# Patient Record
Sex: Female | Born: 1995 | Hispanic: Yes | Marital: Single | Smoking: Never smoker
Health system: Southern US, Community
[De-identification: ages and names within clinical notes are randomized; demographics above are authoritative.]

## PROBLEM LIST (undated history)

## (undated) DIAGNOSIS — Z789 Other specified health status: Secondary | ICD-10-CM

## (undated) DIAGNOSIS — R011 Cardiac murmur, unspecified: Secondary | ICD-10-CM

## (undated) HISTORY — DX: Cardiac murmur, unspecified: R01.1

## (undated) HISTORY — PX: BREAST SURGERY: SHX581

## (undated) HISTORY — PX: BREAST CYST EXCISION: SHX579

---

## 2018-12-15 NOTE — L&D Delivery Note (Signed)
Delivery Note At  0745 am a viable and healthy female was delivered via  (Presentation:LOA ;  ).  APGAR: 8,9 ; weight 7#3oz  .   Placenta status:delivered intact with 3 vessel  Cord:  with the following complications: MSAF, uterine atony of lower segment with large clot expressed- resolved with rectal 810mcg cytotec and bimanual massage  Anesthesia:  none Episiotomy:  none Lacerations:  2nd right sidewall laceration Suture Repair: 3.0 vicryl rapide Est. Blood Loss (mL):  800  Mom to postpartum.  Baby to Couplet care / Skin to Skin.  Dupree Givler N Aliviyah Malanga 09/17/2019, 8:22 AM

## 2019-02-03 ENCOUNTER — Emergency Department
Admission: EM | Admit: 2019-02-03 | Discharge: 2019-02-03 | Disposition: A | Discharge status: Home / Self Care | Payer: Medicaid Other | Attending: Emergency Medicine | Admitting: Emergency Medicine

## 2019-02-03 ENCOUNTER — Other Ambulatory Visit: Payer: Self-pay

## 2019-02-03 ENCOUNTER — Emergency Department: Payer: Medicaid Other

## 2019-02-03 ENCOUNTER — Encounter: Payer: Self-pay | Admitting: Emergency Medicine

## 2019-02-03 DIAGNOSIS — R102 Pelvic and perineal pain: Secondary | ICD-10-CM

## 2019-02-03 DIAGNOSIS — O209 Hemorrhage in early pregnancy, unspecified: Secondary | ICD-10-CM | POA: Diagnosis present

## 2019-02-03 DIAGNOSIS — Z3A10 10 weeks gestation of pregnancy: Secondary | ICD-10-CM | POA: Diagnosis not present

## 2019-02-03 LAB — CBC
HCT: 36.5 % (ref 36.0–46.0)
HEMOGLOBIN: 12.4 g/dL (ref 12.0–15.0)
MCH: 30.5 pg (ref 26.0–34.0)
MCHC: 34 g/dL (ref 30.0–36.0)
MCV: 89.7 fL (ref 80.0–100.0)
Platelets: 311 10*3/uL (ref 150–400)
RBC: 4.07 MIL/uL (ref 3.87–5.11)
RDW: 12.3 % (ref 11.5–15.5)
WBC: 10.8 10*3/uL — ABNORMAL HIGH (ref 4.0–10.5)
nRBC: 0 % (ref 0.0–0.2)

## 2019-02-03 LAB — BASIC METABOLIC PANEL
Anion gap: 8 (ref 5–15)
BUN: 8 mg/dL (ref 6–20)
CO2: 23 mmol/L (ref 22–32)
Calcium: 9 mg/dL (ref 8.9–10.3)
Chloride: 104 mmol/L (ref 98–111)
Creatinine, Ser: 0.49 mg/dL (ref 0.44–1.00)
GFR calc Af Amer: >60 mL/min (ref >60)
GFR calc non Af Amer: >60 mL/min (ref >60)
Glucose, Bld: 79 mg/dL (ref 70–99)
POTASSIUM: 3.7 mmol/L (ref 3.5–5.1)
SODIUM: 135 mmol/L (ref 135–145)

## 2019-02-03 LAB — WET PREP, GENITAL
Clue Cells Wet Prep HPF POC: NONE SEEN
Sperm: NONE SEEN
TRICH WET PREP: NONE SEEN
YEAST WET PREP: NONE SEEN

## 2019-02-03 LAB — HCG, QUANTITATIVE, PREGNANCY: hCG, Beta Chain, Quant, S: 180172 m[IU]/mL — ABNORMAL HIGH (ref <5)

## 2019-02-03 LAB — CHLAMYDIA/NGC RT PCR (ARMC ONLY)
Chlamydia Tr: NOT DETECTED
N gonorrhoeae: NOT DETECTED

## 2019-02-03 LAB — ABO/RH: ABO/RH(D): O POS

## 2019-02-03 NOTE — ED Notes (Signed)
Pink top for ABO/Rh sent to lab at this time.

## 2019-02-03 NOTE — ED Notes (Signed)
Pelvic cart set up at bedside  

## 2019-02-03 NOTE — ED Triage Notes (Signed)
Pt in via POV, reports abdominal pain and vaginal bleeding since 1230.  Pt is approximately [redacted] weeks pregnant, states initial OB appointment is not until March.  Vitals WDL, NAD noted at this time.

## 2019-02-03 NOTE — ED Provider Notes (Signed)
[facility] Emergency Department Provider Note  ____________________________________________  Time seen: Approximately 9:39 PM  I have reviewed the triage vital signs and the nursing notes.   HISTORY  Chief Complaint Threatened Miscarriage    HPI Krista Garcia is a 23 y.o. female 1 P0 approximately [redacted] weeks pregnant presenting for vaginal bleeding and pelvic cramping.  Patient reports that she has been seen 1 time for prenatal care, but does not remember the physician or which clinic she went to.  She reports that today just afternoon, she developed vaginal bleeding which was initially very light, but has become more heavy like a period.  She has not had any blood clots.  She has not had any lightheadedness, shortness of breath or syncope.  No change in vaginal discharge.  No fevers or chills.  History reviewed. No pertinent past medical history.  There are no active problems to display for this patient.   Past Surgical History:  Procedure Laterality Date  . BREAST SURGERY        Allergies Patient has no known allergies.  No family history on file.  Social History Social History   Tobacco Use  . Smoking status: Never Smoker  . Smokeless tobacco: Never Used  Substance Use Topics  . Alcohol use: Not Currently  . Drug use: Never    Review of Systems Constitutional: No fever/chills. Eyes: No visual changes. ENT: No sore throat. No congestion or rhinorrhea. Cardiovascular: Denies chest pain. Denies palpitations. Respiratory: Denies shortness of breath.  No cough. Gastrointestinal: As of pelvic cramping.  No nausea, no vomiting.  No diarrhea.  No constipation. Genitourinary: Negative for dysuria.  Positive vaginal bleeding.  No change in vaginal discharge. Musculoskeletal: Negative for back pain. Skin: Negative for rash. Neurological: Negative for headaches. No focal numbness, tingling or weakness.      ____________________________________________   PHYSICAL EXAM:  VITAL SIGNS: ED Triage Vitals  Enc Vitals Group     BP --      Pulse --      Resp --      Temp --      Temp src --      SpO2 --      Weight 02/03/19 1537 120 lb (54.4 kg)     Height 02/03/19 1537 5\' 2"  (1.575 m)     Head Circumference --      Peak Flow --      Pain Score 02/03/19 1536 3     Pain Loc --      Pain Edu? --      Excl. in GC? --     Constitutional: Alert and oriented.  Answers questions appropriately. Eyes: Conjunctivae are normal without pallor.  EOMI. No scleral icterus. Head: Atraumatic. Nose: No congestion/rhinnorhea. Mouth/Throat: Mucous membranes are moist.  Neck: No stridor.  Supple.   Cardiovascular: Normal rate, regular rhythm. No murmurs, rubs or gallops.  Respiratory: Normal respiratory effort.  No accessory muscle use or retractions. Lungs CTAB.  No wheezes, rales or ronchi. Gastrointestinal: Soft, nontender and nondistended.  No guarding or rebound.  No peritoneal signs. Genitourinary: Normal-appearing external genitalia without lesions. Normal vaginal exam with physiologic discharge, normal-appearing cervix, normal vaginal wall tissue. Bimanual exam is negative for CMT, adnexal tenderness to palpation, no palpable masses. Musculoskeletal: No LE edema.  Neurologic:  A&Ox3.  Speech is clear.  Face and smile are symmetric.  EOMI.  Moves all extremities well. Skin:  Skin is warm, dry and intact. No rash noted. Psychiatric:  Mood and affect are normal. Speech and behavior are normal.  Normal judgement  ____________________________________________   LABS (all labs ordered are listed, but only abnormal results are displayed)  Labs Reviewed  WET PREP, GENITAL - Abnormal; Notable for the following components:      Result Value   WBC, Wet Prep HPF POC MANY (*)    All other components within normal limits  HCG, QUANTITATIVE, PREGNANCY - Abnormal; Notable for the following components:    hCG, Beta Chain, Quant, S 180,172 (*)    All other components within normal limits  CBC - Abnormal; Notable for the following components:   WBC 10.8 (*)    All other components within normal limits  CHLAMYDIA/NGC RT PCR ([facility] ONLY)  BASIC METABOLIC PANEL  ABO/RH   ____________________________________________  EKG  None indicated ____________________________________________  RADIOLOGY  [place] Ob Transvaginal  Result Date: 02/03/2019 CLINICAL DATA:  Vaginal bleeding and pelvic pain in early pregnancy EXAM: OBSTETRIC <14 WK US AND TRANSVAGINAL OB US TECHNIQUE: Both transabdominal and transvaginal ultrasound examinations were performed for complete evaluation of the gestation as well as the maternal uterus, adnexal regions, and pelvic cul-de-sac. Transvaginal technique was performed to assess early pregnancy. COMPARISON:  None FINDINGS: Intrauterine gestational sac: Present, single Yolk sac:  Present Embryo:  Present Cardiac Activity: Present Heart Rate: 155 bpm CRL:  15.8 mm   8 w   0 d                  US EDC: 09/15/2019 Subchorionic hemorrhage:  None identified Maternal uterus/adnexae: RIGHT ovary measures 2.6 x 2.8 x 3.1 cm and contains a corpus luteum 1.9 x 1.7 x 1.4 cm. LEFT ovary not visualized likely obscured by bowel. No free pelvic fluid or adnexal masses. IMPRESSION: Single live intrauterine gestation at 8 weeks 0 days EGA by crown-rump length. No acute abnormalities. Electronically Signed   By: Ulyses SouthwardMark  Boles M.D.   On: 02/03/2019 17:42    ____________________________________________   PROCEDURES  Procedure(s) performed: None  Procedures  Critical Care performed: No ____________________________________________   INITIAL IMPRESSION / ASSESSMENT AND PLAN / ED COURSE  Pertinent labs & imaging results that were available during my care of the patient were reviewed by me and considered in my medical decision making (see chart for details).  23 y.o. female G1, P0  approximate [redacted] weeks pregnant presenting with vaginal bleeding and pelvic cramping that started today.  Overall, the patient is hemodynamically stable and has a reassuring hemoglobin and hematocrit at 12.4 and 36.5.  Her electrolytes are also within normal limits.  Her hCG today is 180,172.  She has a transvaginal ultrasound which does show a single live IUP with a fetal heart rate of 155; there is no evidence of subchorionic hemorrhage or other acute abnormalities.  I will plan to do a pelvic examination, and anticipate discharge with close obstetrics follow-up.  ----------------------------------------- 10:33 PM on 02/03/2019 -----------------------------------------  Have spoken with Dr. Ranae Plumberhelsea Ward, who will see the patient on Monday.  The patient will be instructed to call the clinic tomorrow to make an appointment for Monday.  At this time, the patient has vaginal bleeding of first trimester with an unclear etiology.  I have spoken to her about the various possibilities, and given her her follow-up instructions as well as return precautions.  At this time, I am just waiting for her Rh type for final discharge.  ____________________________________________  FINAL CLINICAL IMPRESSION(S) / ED DIAGNOSES  Final diagnoses:  Vaginal bleeding in  pregnancy, first trimester  Pelvic cramping         NEW MEDICATIONS STARTED DURING THIS VISIT:  New Prescriptions   No medications on file      Rockne Menghini, MD 02/03/19 2235

## 2019-02-03 NOTE — Discharge Instructions (Signed)
You may take Tylenol in pregnancy, but do not take any NSAID medications including Motrin, ibuprofen, Aleve or Advil.  Please follow the instructions for pelvic rest until you are cleared by your OB/GYN.  Return to the emergency department if you develop increased bleeding, soaking through more than 1 pad every 2 hours, lightheadedness, fainting, shortness of breath, fever, or any other symptoms concerning to you.

## 2019-04-01 ENCOUNTER — Other Ambulatory Visit: Payer: Self-pay | Admitting: Family Medicine

## 2019-04-01 DIAGNOSIS — Z3402 Encounter for supervision of normal first pregnancy, second trimester: Secondary | ICD-10-CM

## 2019-04-21 ENCOUNTER — Ambulatory Visit: Payer: Self-pay

## 2019-05-06 ENCOUNTER — Other Ambulatory Visit: Payer: Self-pay

## 2019-05-06 ENCOUNTER — Ambulatory Visit: Payer: Self-pay

## 2019-05-06 ENCOUNTER — Ambulatory Visit
Admission: RE | Admit: 2019-05-06 | Discharge: 2019-05-06 | Disposition: A | Discharge status: Home / Self Care | Payer: Medicaid Other | Source: Ambulatory Visit | Attending: Family Medicine | Admitting: Family Medicine

## 2019-05-06 DIAGNOSIS — Z3402 Encounter for supervision of normal first pregnancy, second trimester: Secondary | ICD-10-CM | POA: Diagnosis present

## 2019-06-27 ENCOUNTER — Other Ambulatory Visit: Payer: Self-pay

## 2019-06-27 ENCOUNTER — Observation Stay
Admission: EM | Admit: 2019-06-27 | Discharge: 2019-06-28 | Disposition: A | Discharge status: Home / Self Care | Payer: Medicaid Other | Attending: Obstetrics & Gynecology | Admitting: Obstetrics & Gynecology

## 2019-06-27 DIAGNOSIS — O26893 Other specified pregnancy related conditions, third trimester: Principal | ICD-10-CM | POA: Insufficient documentation

## 2019-06-27 DIAGNOSIS — R102 Pelvic and perineal pain: Secondary | ICD-10-CM | POA: Insufficient documentation

## 2019-06-27 DIAGNOSIS — Z3A28 28 weeks gestation of pregnancy: Secondary | ICD-10-CM | POA: Insufficient documentation

## 2019-06-27 DIAGNOSIS — Z3490 Encounter for supervision of normal pregnancy, unspecified, unspecified trimester: Secondary | ICD-10-CM

## 2019-06-27 HISTORY — DX: Other specified health status: Z78.9

## 2019-06-27 NOTE — Progress Notes (Signed)
Electronic Interpreter used for triage, [phone] [employer]

## 2019-06-27 NOTE — OB Triage Note (Signed)
Pt arrival to triage with c/o abdominal pain and lower left back pain starting yesterday afternoon.  States pain 8/10.  Pt denies vaginal bleeding and LOF. States positive fetal movement.  EFM and toco applied and assessing.

## 2019-06-28 DIAGNOSIS — O26893 Other specified pregnancy related conditions, third trimester: Secondary | ICD-10-CM | POA: Diagnosis not present

## 2019-06-28 DIAGNOSIS — Z3490 Encounter for supervision of normal pregnancy, unspecified, unspecified trimester: Secondary | ICD-10-CM

## 2019-06-28 DIAGNOSIS — R102 Pelvic and perineal pain: Secondary | ICD-10-CM | POA: Diagnosis present

## 2019-06-28 DIAGNOSIS — Z3A28 28 weeks gestation of pregnancy: Secondary | ICD-10-CM | POA: Diagnosis not present

## 2019-06-28 NOTE — OB Triage Note (Signed)
Discharge instructions provided and reviewed.  Follow up care discussed.  Pain management reviewed.  Pt verbalizes understanding.

## 2019-06-28 NOTE — Discharge Summary (Signed)
No LMP recorded. Patient is pregnant. EDC: Estimated Date of Delivery: 09/17/19 EGA: [redacted]w[redacted]d  Patient presented for evaluation of pelvic discomfort/pain.  Patient had reactive NST and her TOCO was absent.. I reviewed her vital signs and fetal tracing, both of which were reassuring.  Patient was discharged as she was not laboring, and pain was non-specific.Marland Kitchen  NST interpretation: Reactive.  Larey Days, MD Attending Obstetrician and Gynecologist [facility]

## 2019-07-05 ENCOUNTER — Encounter: Payer: Self-pay | Admitting: Certified Nurse Midwife

## 2019-07-05 ENCOUNTER — Other Ambulatory Visit: Payer: Self-pay

## 2019-07-05 ENCOUNTER — Ambulatory Visit (INDEPENDENT_AMBULATORY_CARE_PROVIDER_SITE_OTHER): Payer: Medicaid Other | Admitting: Certified Nurse Midwife

## 2019-07-05 VITALS — BP 111/66 | HR 103 | Wt 88.2 lb

## 2019-07-05 DIAGNOSIS — Z3A29 29 weeks gestation of pregnancy: Secondary | ICD-10-CM

## 2019-07-05 DIAGNOSIS — Z3403 Encounter for supervision of normal first pregnancy, third trimester: Secondary | ICD-10-CM

## 2019-07-05 LAB — POCT URINALYSIS DIPSTICK OB
Bilirubin, UA: NEGATIVE
Glucose, UA: NEGATIVE
Ketones, UA: NEGATIVE
Leukocytes, UA: NEGATIVE
Nitrite, UA: NEGATIVE
POC,PROTEIN,UA: NEGATIVE
Spec Grav, UA: 1.025 (ref 1.010–1.025)
Urobilinogen, UA: 0.2 E.U./dL
pH, UA: 5 (ref 5.0–8.0)

## 2019-07-05 NOTE — Patient Instructions (Signed)

## 2019-07-05 NOTE — Progress Notes (Signed)
TRANSFER IN OB HISTORY AND PHYSICAL  SUBJECTIVE:       Krista Garcia is a 23 y.o. G1P0 female, No LMP recorded. Patient is pregnant., Estimated Date of Delivery: 09/18/19, 9631w2d, presents today for Transition of Prenatal Care. EPIC data migration from outside records is not accomplished today. She has signed a release of medical records for [facility]. Complaints today include: breast changes      Gynecologic History No LMP recorded. Patient is pregnant. Normal Contraception: none Last Pap: waiting for records   Obstetric History OB History  Gravida Para Term Preterm AB Living  1            SAB TAB Ectopic Multiple Live Births               # Outcome Date GA Lbr Len/2nd Weight Sex Delivery Anes PTL Lv  1 Current             Past Medical History:  Diagnosis Date  . Medical history non-contributory     Past Surgical History:  Procedure Laterality Date  . BREAST SURGERY      Current Outpatient Medications on File Prior to Visit  Medication Sig Dispense Refill  . Prenatal Vit-Fe Fumarate-FA (PRENATAL MULTIVITAMIN) TABS tablet Take 1 tablet by mouth daily at 12 noon.     No current facility-administered medications on file prior to visit.     No Known Allergies  Social History   Socioeconomic History  . Marital status: Single    Spouse name: Not on file  . Number of children: Not on file  . Years of education: Not on file  . Highest education level: Not on file  Occupational History  . Not on file  Social Needs  . Financial resource strain: Not on file  . Food insecurity    Worry: Not on file    Inability: Not on file  . Transportation needs    Medical: Not on file    Non-medical: Not on file  Tobacco Use  . Smoking status: Never Smoker  . Smokeless tobacco: Never Used  Substance and Sexual Activity  . Alcohol use: Not Currently  . Drug use: Never  . Sexual activity: Yes  Lifestyle  . Physical activity    Days per week: Not on file     Minutes per session: Not on file  . Stress: Not on file  Relationships  . Social Musicianconnections    Talks on phone: Not on file    Gets together: Not on file    Attends religious service: Not on file    Active member of club or organization: Not on file    Attends meetings of clubs or organizations: Not on file    Relationship status: Not on file  . Intimate partner violence    Fear of current or ex partner: Not on file    Emotionally abused: Not on file    Physically abused: Not on file    Forced sexual activity: Not on file  Other Topics Concern  . Not on file  Social History Narrative  . Not on file    No family history on file.  The following portions of the patient's history were reviewed and updated as appropriate: allergies, current medications, past OB history, past medical history, past surgical history, past family history, past social history, and problem list.    OBJECTIVE: Initial Physical Exam (New OB)  GENERAL APPEARANCE: alert, well appearing, in no apparent distress, oriented to person,  place and time HEAD: normocephalic, atraumatic MOUTH: mucous membranes moist, pharynx normal without lesions THYROID: no thyromegaly or masses present BREASTS: exam deferred  LUNGS: clear to auscultation, no wheezes, rales or rhonchi, symmetric air entry HEART: regular rate and rhythm, no murmurs ABDOMEN: soft, nontender, nondistended, no abnormal masses, no epigastric pain, fundus soft, nontender 30 weeks size and FHT present EXTREMITIES: no redness or tenderness in the calves or thighs, no edema, no limitation in range of motion, intact peripheral pulses SKIN: normal coloration and turgor, no rashes LYMPH NODES: no adenopathy palpable NEUROLOGIC: alert, oriented, normal speech, no focal findings or movement disorder noted  PELVIC EXAM deferred   ASSESSMENT: Normal pregnancy  PLAN: New OB counseling: The patient has been given an overview regarding routine prenatal  care. Recommendations regarding diet, weight gain, and exercise in pregnancy were given. Overview of PNC discussed. Awaiting records from previous provider. Pt states she completed 3 hr glucose test does not have result. Follow up 2 wks for ROB.   Interpreter used for this visit.   Philip Aspen, CNM

## 2019-07-05 NOTE — Addendum Note (Signed)
Addended by: Raliegh Ip on: 07/05/2019 02:10 PM   Modules accepted: Orders

## 2019-07-19 ENCOUNTER — Encounter: Payer: Self-pay | Admitting: Certified Nurse Midwife

## 2019-07-19 ENCOUNTER — Other Ambulatory Visit: Payer: Self-pay

## 2019-07-19 ENCOUNTER — Ambulatory Visit (INDEPENDENT_AMBULATORY_CARE_PROVIDER_SITE_OTHER): Payer: Medicaid Other | Admitting: Certified Nurse Midwife

## 2019-07-19 VITALS — BP 109/44 | HR 84 | Wt 151.4 lb

## 2019-07-19 DIAGNOSIS — Z3403 Encounter for supervision of normal first pregnancy, third trimester: Secondary | ICD-10-CM

## 2019-07-19 LAB — POCT URINALYSIS DIPSTICK OB
Bilirubin, UA: NEGATIVE
Blood, UA: NEGATIVE
Glucose, UA: NEGATIVE
Ketones, UA: NEGATIVE
Leukocytes, UA: NEGATIVE
Nitrite, UA: NEGATIVE
POC,PROTEIN,UA: NEGATIVE
Spec Grav, UA: 1.015 (ref 1.010–1.025)
Urobilinogen, UA: 0.2 E.U./dL
pH, UA: 6.5 (ref 5.0–8.0)

## 2019-07-19 NOTE — Progress Notes (Signed)
ROB doing well. Feel good movement. Have not received records as of yet. Will send another release of medical records to [facility]. Follow up 2 wks with [facility].   Philip Aspen, CNM

## 2019-07-19 NOTE — Addendum Note (Signed)
Addended by: Raliegh Ip on: 07/19/2019 11:13 AM   Modules accepted: Orders

## 2019-07-19 NOTE — Patient Instructions (Signed)
How a Baby Grows During Pregnancy  Pregnancy begins when a female's sperm enters a female's egg (fertilization). Fertilization usually happens in one of the tubes (fallopian tubes) that connect the ovaries to the womb (uterus). The fertilized egg moves down the fallopian tube to the uterus. Once it reaches the uterus, it implants into the lining of the uterus and begins to grow. For the first 10 weeks, the fertilized egg is called an embryo. After 10 weeks, it is called a fetus. As the fetus continues to grow, it receives oxygen and nutrients through tissue (placenta) that grows to support the developing baby. The placenta is the life support system for the baby. It provides oxygen and nutrition and removes waste. Learning as much as you can about your pregnancy and how your baby is developing can help you enjoy the experience. It can also make you aware of when there might be a problem and when to ask questions. How long does a typical pregnancy last? A pregnancy usually lasts 280 days, or about 40 weeks. Pregnancy is divided into three periods of growth, also called trimesters:  First trimester: 0-12 weeks.  Second trimester: 13-27 weeks.  Third trimester: 28-40 weeks. The day when your baby is ready to be born (full term) is your estimated date of delivery. How does my baby develop month by month? First month  The fertilized egg attaches to the inside of the uterus.  Some cells will form the placenta. Others will form the fetus.  The arms, legs, brain, spinal cord, lungs, and heart begin to develop.  At the end of the first month, the heart begins to beat. Second month  The bones, inner ear, eyelids, hands, and feet form.  The genitals develop.  By the end of 8 weeks, all major organs are developing. Third month  All of the internal organs are forming.  Teeth develop below the gums.  Bones and muscles begin to grow. The spine can flex.  The skin is transparent.  Fingernails  and toenails begin to form.  Arms and legs continue to grow longer, and hands and feet develop.  The fetus is about 3 inches (7.6 cm) long. Fourth month  The placenta is completely formed.  The external sex organs, neck, outer ear, eyebrows, eyelids, and fingernails are formed.  The fetus can hear, swallow, and move its arms and legs.  The kidneys begin to produce urine.  The skin is covered with a white, waxy coating (vernix) and very fine hair (lanugo). Fifth month  The fetus moves around more and can be felt for the first time (quickening).  The fetus starts to sleep and wake up and may begin to suck its finger.  The nails grow to the end of the fingers.  The organ in the digestive system that makes bile (gallbladder) functions and helps to digest nutrients.  If your baby is a girl, eggs are present in her ovaries. If your baby is a boy, testicles start to move down into his scrotum. Sixth month  The lungs are formed.  The eyes open. The brain continues to develop.  Your baby has fingerprints and toe prints. Your baby's hair grows thicker.  At the end of the second trimester, the fetus is about 9 inches (22.9 cm) long. Seventh month  The fetus kicks and stretches.  The eyes are developed enough to sense changes in light.  The hands can make a grasping motion.  The fetus responds to sound. Eighth month  All   organs and body systems are fully developed and functioning.  Bones harden, and taste buds develop. The fetus may hiccup.  Certain areas of the brain are still developing. The skull remains soft. Ninth month  The fetus gains about  lb (0.23 kg) each week.  The lungs are fully developed.  Patterns of sleep develop.  The fetus's head typically moves into a head-down position (vertex) in the uterus to prepare for birth.  The fetus weighs 6-9 lb (2.72-4.08 kg) and is 19-20 inches (48.26-50.8 cm) long. What can I do to have a healthy pregnancy and help  my baby develop? General instructions  Take prenatal vitamins as directed by your health care provider. These include vitamins such as folic acid, iron, calcium, and vitamin D. They are important for healthy development.  Take medicines only as directed by your health care provider. Read labels and ask a pharmacist or your health care provider whether over-the-counter medicines, supplements, and prescription drugs are safe to take during pregnancy.  Keep all follow-up visits as directed by your health care provider. This is important. Follow-up visits include prenatal care and screening tests. How do I know if my baby is developing well? At each prenatal visit, your health care provider will do several different tests to check on your health and keep track of your baby's development. These include:  Fundal height and position. ? Your health care provider will measure your growing belly from your pubic bone to the top of the uterus using a tape measure. ? Your health care provider will also feel your belly to determine your baby's position.  Heartbeat. ? An ultrasound in the first trimester can confirm pregnancy and show a heartbeat, depending on how far along you are. ? Your health care provider will check your baby's heart rate at every prenatal visit.  Second trimester ultrasound. ? This ultrasound checks your baby's development. It also may show your baby's gender. What should I do if I have concerns about my baby's development? Always talk with your health care provider about any concerns that you may have about your pregnancy and your baby. Summary  A pregnancy usually lasts 280 days, or about 40 weeks. Pregnancy is divided into three periods of growth, also called trimesters.  Your health care provider will monitor your baby's growth and development throughout your pregnancy.  Follow your health care provider's recommendations about taking prenatal vitamins and medicines during  your pregnancy.  Talk with your health care provider if you have any concerns about your pregnancy or your developing baby. This information is not intended to replace advice given to you by your health care provider. Make sure you discuss any questions you have with your health care provider. Document Released: 05/19/2008 Document Revised: 03/24/2019 Document Reviewed: 10/14/2017 Elsevier Patient Education  2020 [employer]  

## 2019-08-05 ENCOUNTER — Encounter: Payer: Medicaid Other | Admitting: Obstetrics and Gynecology

## 2019-08-08 ENCOUNTER — Other Ambulatory Visit: Payer: Self-pay

## 2019-08-08 ENCOUNTER — Ambulatory Visit (INDEPENDENT_AMBULATORY_CARE_PROVIDER_SITE_OTHER): Payer: Medicaid Other | Admitting: Certified Nurse Midwife

## 2019-08-08 VITALS — BP 105/61 | HR 76 | Wt 157.2 lb

## 2019-08-08 DIAGNOSIS — Z3A33 33 weeks gestation of pregnancy: Secondary | ICD-10-CM

## 2019-08-08 DIAGNOSIS — Z3493 Encounter for supervision of normal pregnancy, unspecified, third trimester: Secondary | ICD-10-CM

## 2019-08-08 LAB — POCT URINALYSIS DIPSTICK OB
Bilirubin, UA: NEGATIVE
Blood, UA: NEGATIVE
Glucose, UA: NEGATIVE
Ketones, UA: NEGATIVE
Leukocytes, UA: NEGATIVE
Nitrite, UA: NEGATIVE
POC,PROTEIN,UA: NEGATIVE
Spec Grav, UA: 1.01 (ref 1.010–1.025)
Urobilinogen, UA: 0.2 E.U./dL
pH, UA: 6 (ref 5.0–8.0)

## 2019-08-08 NOTE — Progress Notes (Signed)
ROB-Patient c/o intermittent pelvic and lower back pain x1 month.

## 2019-08-08 NOTE — Patient Instructions (Signed)
Evaluación de los movimientos fetales °Fetal Movement Counts °Introducción °Nombre del paciente: ________________________________________________ Fecha de parto estimada: ____________________ °¿Qué es una evaluación de los movimientos fetales? ° °Una evaluación de los movimientos fetales es el registro del número de veces que siente que el bebé se mueve durante un cierto período de tiempo. Esto también se puede denominar recuento de patadas fetales. Una evaluación de movimientos fetales se recomienda a todas las embarazadas. Es posible que le indiquen que comience a evaluar los movimientos fetales desde la semana 28 de embarazo. °Preste atención cuando sienta que el bebé está más activo. Podrá detectar los ciclos en que el bebé duerme y está despierto. También podrá detectar que ciertas cosas hacen que su bebé se mueva más. Deberá realizar una evaluación de los movimientos fetales en las siguientes situaciones: °· Cuando el bebé está más activo habitualmente. °· A la misma hora, todos los días. °Un buen momento para evaluar los movimientos fetales es cuando está descansando, después de haber comido y bebido algo. °¿Cómo debo contar los movimientos fetales? °1. Encuentre un lugar tranquilo y cómodo. Siéntese o acuéstese de lado. °2. Anote la fecha, la hora de inicio y de finalización y la cantidad de movimientos que sintió entre esas dos horas. Lleve esta información a las visitas de control. °3. Cuente las pataditas, revoloteos, chasquidos, vueltas o pinchazos en un período de 2 horas. Debe sentir al menos 10 movimientos en 2 horas. °4. Cuando sienta 10 movimientos, puede dejar de contar. °5. Si no siente 10 movimientos en 2 horas, coma y beba algo. Luego, continúe descansando y contando durante 1 hora. Si siente al menos 4 movimientos durante esa hora, puede dejar de contar. °Comuníquese con un médico si: °· Siente menos de 4 movimientos en 2 horas. °· El bebé no se mueve tanto como suele hacerlo. °Fecha:  ____________ Hora de inicio: ____________ Hora de finalización: ____________ Movimientos: ____________ °Fecha: ____________ Hora de inicio: ____________ Hora de finalización: ____________ Movimientos: ____________ °Fecha: ____________ Hora de inicio: ____________ Hora de finalización: ____________ Movimientos: ____________ °Fecha: ____________ Hora de inicio: ____________ Hora de finalización: ____________ Movimientos: ____________ °Fecha: ____________ Hora de inicio: ____________ Hora de finalización: ____________ Movimientos: ____________ °Fecha: ____________ Hora de inicio: ____________ Hora de finalización: ____________ Movimientos: ____________ °Fecha: ____________ Hora de inicio: ____________ Hora de finalización: ____________ Movimientos: ____________ °Fecha: ____________ Hora de inicio: ____________ Hora de finalización: ____________ Movimientos: ____________ °Fecha: ____________ Hora de inicio: ____________ Hora de finalización: ____________ Movimientos: ____________ °Esta información no tiene como fin reemplazar el consejo del médico. Asegúrese de hacerle al médico cualquier pregunta que tenga. °Document Released: 03/09/2008 Document Revised: 03/06/2017 Document Reviewed: 01/10/2016 °Elsevier Patient Education © 2020 Elsevier Inc. ° °

## 2019-08-08 NOTE — Progress Notes (Signed)
ROB-Reports intermittent pelvic and low back pain. Discussed home treatment measures including use of abdominal support. Notes "all normal labs" from [facility]. Anticipatory guidance regarding course of prenatal care. Reviewed red flag symptoms and when to call. RTC x 2 weeks for 36 week cultures and ROB or sooner if needed.

## 2019-08-24 ENCOUNTER — Ambulatory Visit (INDEPENDENT_AMBULATORY_CARE_PROVIDER_SITE_OTHER): Payer: Medicaid Other | Admitting: Obstetrics and Gynecology

## 2019-08-24 ENCOUNTER — Other Ambulatory Visit: Payer: Self-pay

## 2019-08-24 VITALS — BP 104/68 | HR 85 | Wt 160.4 lb

## 2019-08-24 DIAGNOSIS — Z23 Encounter for immunization: Secondary | ICD-10-CM

## 2019-08-24 DIAGNOSIS — Z3685 Encounter for antenatal screening for Streptococcus B: Secondary | ICD-10-CM

## 2019-08-24 DIAGNOSIS — Z113 Encounter for screening for infections with a predominantly sexual mode of transmission: Secondary | ICD-10-CM

## 2019-08-24 DIAGNOSIS — Z3493 Encounter for supervision of normal pregnancy, unspecified, third trimester: Secondary | ICD-10-CM

## 2019-08-24 LAB — POCT URINALYSIS DIPSTICK OB
Bilirubin, UA: NEGATIVE
Blood, UA: NEGATIVE
Glucose, UA: NEGATIVE
Ketones, UA: NEGATIVE
Leukocytes, UA: NEGATIVE
Nitrite, UA: NEGATIVE
POC,PROTEIN,UA: NEGATIVE
Spec Grav, UA: 1.01 (ref 1.010–1.025)
Urobilinogen, UA: 0.2 E.U./dL
pH, UA: 6 (ref 5.0–8.0)

## 2019-08-24 LAB — OB RESULTS CONSOLE GC/CHLAMYDIA: Gonorrhea: NEGATIVE

## 2019-08-24 NOTE — Patient Instructions (Signed)
Krista Garcia y lactancia inducida Breastfeeding and Inducing Lactation La lactancia inducida es un proceso por el cual se estimula la produccin de [place] materna en una mujer que no est producindola. La lactancia inducida puede realizarse en los siguientes casos:  Adopcin.  Otra mujer da a luz a su beb (subrogacin).  La lactancia materna se reinicia despus de suspenderla por un perodo.  Una madre est amamantando todava a un nio y desea satisfacer el suministro de [place] para un recin nacido. La lactancia inducida es ms probable que tenga xito en las mujeres que han estado previamente [place]. Cmo produce el cuerpo la [employer] Pal[employer]ive? El O[place] de produccin de [place] materna comienza cuando [place]. En ese momento, las hormonas en su cuerpo [place] para preparar al cuerpo para que produzca [address]. Una vez su beb que nace, las hormonas envan seales que le dicen al cuerpo que produzca [place]. Cmo funciona la lactancia inducida? La lactancia inducida reproduce Liberty Mutual el cuerpo [place] de [place] natural para producir [employer]. Para ayudar a producir [employer], es posible que deba:  Tomar medicamentos.  Practicar tcnicas de estimulacin de las mamas. Las tcnicas de estimulacin de las mamas imitan la succin de un beb en el pecho. Estas pueden realizarse de las siguientes maneras: ? Suavemente frote y estire sus pezones. ? Use un sacaleche elctrico doble de calidad hospitalaria para extraer [employer]. Si opta por la lactancia inducida, es probable que tenga que comenzar a tomar hormonas 3o antes de Corporate investment banker a Museum/gallery exhibitions officer. Aproximadamente 6semanas antes de la fecha de inicio prevista de la Market researcher, usted puede Ecologist de tomar los medicamentos y Corporate investment banker a Retail buyer de estimulacin de las mamas varias veces al da. Si [place] un sacaleche para extraer Teaching laboratory technician, Holiday representative en ambas mamas al mismo tiempo cada  3horas (8veces al da) durante . Una vez que su cuerpo est produciendo [place] y usted comience a Museum/gallery exhibitions officer, su cuerpo naturalmente aumentar la cantidad de [place] que produce en respuesta al [place], sonido y al contacto con su beb. Producir la cantidad de leche suficiente para alimentar al beb? Muy pocas mujeres son capaces de producir toda la leche que el beb necesita. Si opta por la lactancia inducida, puede necesitar suplementar la alimentacin con [employer] que haya sido donada o con leche maternizada para garantizar que su beb reciba suficiente nutricin. Qu ms necesito saber?  Tome los medicamentos solamente como se lo hayan indicado el mdico o un asesor en lactancia capacitado.  Hay medicamentos a base de hierbas disponibles para inducir la lactancia. Estos medicamentos no cuentan con la aprobacin ni estn regulados por la FDA. Consulte siempre al mdico antes de tomar algn medicamento a base de hierbas.  Si necesita orientacin, hable con el mdico o Microbiologist. Ellos podrn ayudarla a que empiece a producir [employer] y Journalist, newspaper al tomar decisiones importantes sobre cmo alimentar al beb.  La lactancia inducida puede hacer que experimente algunos cambios en su cuerpo, tales como: ? Cambios de leves a moderados en su ciclo menstrual. ? Algunos cambios que incluyen una sensacin de plenitud en las mamas (turgencia). ? Algunos cambios en la forma de las [place]. ? Leche que se escapa de las mamas de vez en cuando.  Hay disponibles sistemas de alimentacin suplementaria para proporcionarle al beb [place] materna donada o leche maternizada extra en la mama mientras se [place]. Los sistemas garantizan que el beb reciba suficiente nutricin durante la Market researcher. Consulte a  un especialista en lactancia para que la ayude a Paramedic dispositivo y a usarlo.  Los recin nacidos o bebs menores de 70mes de vida usualmente presentan reflejo de bsqueda y aceptan la  mama cuando se [place] un sistema de alimentacin suplementaria. Reflejo de bsqueda es cuando un beb abre la boca al acariciarle la mejilla o los labios. Comunquese con un mdico si:  Su beb tiene ms de 5 das de vida y: ? No parece satisfecho despus de amamantarlo. ? No moja 5 a 6 paales por da. ? No tiene 3 deposiciones por da. Solicite ayuda de inmediato si:  Sus mamas estn hinchadas, enrojecidas o doloridas. Resumen  El embarazo prepara naturalmente las mamas para producir [place]. La lactancia inducida es un proceso por el cual se estimula la produccin de [place] materna en una mujer que no est producindola.  La lactancia generalmente se induce tomando medicamentos y practicando tcnicas de estimulacin de las [place].  Muy pocas mujeres son capaces de producir toda la leche que el beb necesita. Puede necesitar suplementar la alimentacin con [employer] que haya sido donada o con leche maternizada para garantizar que su beb reciba suficiente nutricin. Esta informacin no tiene Marine scientist el consejo del mdico. Asegrese de hacerle al mdico cualquier pregunta que tenga. Document Released: 05/19/2008 Document Revised: 05/25/2017 Document Reviewed: 09/23/2013 Elsevier Patient Education  2020 [place] y sntomas del trabajo de parto Signs and Symptoms of Labor El trabajo de parto es el proceso natural del cuerpo por el cual se saca al beb, la placenta y el cordn umbilical del tero. Por lo general, el proceso del [place] de parto comienza cuando el embarazo ha llegado a su trmino, entre 13 y 78 semanas de Media planner. Cmo sabr que estoy prxima a comenzar el trabajo de parto? A medida que el cuerpo se prepara para el trabajo de parto y el nacimiento del beb, puede notar los siguientes sntomas en las semanas y [place] anteriores al trabajo de parto propiamente dicho:  Un fuerte deseo de preparar su casa para recibir al nuevo beb. Esto se denomina  anidacin. La anidacin puede ser un signo de que se est acercando el [place] de [place], y puede ocurrir varias semanas antes del nacimiento. La anidacin puede implicar limpiar y Comptroller.  Una pequea cantidad de mucosidad espesa y con Quarry manager de la vagina (aparicin normal de sangre o prdida del tapn mucoso). Esto puede suceder ms de una semana antes de que comience el [place] de [place], o puede ocurrir justo antes de que comience el [place] de parto a medida que el cuello uterino comienza a ensancharse (dilatarse). En algunas mujeres, el tapn Walt Disney entero de una sola vez. En otras, pueden salir pequeas partes del tapn mucoso de forma gradual [place].  El beb se mueve (desciende) a la parte inferior de la pelvis para ponerse en posicin para el nacimiento (aligeramiento). Cuando esto sucede, puede sentir ms presin en la vejiga y el hueso plvico, y menos presin en las costillas. Esto facilitar la respiracin. Tambin puede hacer que necesite orinar con ms frecuencia y que tenga problemas para hacer de vientre.  Tener "contracciones de Location manager" (contracciones de [employer]) que ocurren a Biomedical engineer (espaciadas de modo desigual) con una diferencia de ms de 10 minutos. Esto tambin se denomina trabajo de parto falso. Las contracciones de [place] de parto falso son comunes luego del ejercicio o la actividad sexual, y se detendrn si cambia de  posicin, descansa o toma lquidos. Estas contracciones son generalmente leves y no se tornan ms fuertes con el tiempo. Pueden sentirse como lo siguiente: ? Un dolor de espalda. ? Calambres leves, similares a los Sonic Automotive. ? Tirantez o presin en el abdomen. Otros sntomas tempranos de que el trabajo de parto puede comenzar pronto incluyen:  Nuseas o prdida del apetito.  Diarrea.  Un repentino estallido de energa o sentirse muy cansada.  Cambios en el humor.  Problemas para  dormir. Cmo sabr cuando ha comenzado el trabajo de parto? Los signos de que ha comenzado el trabajo de parto verdadero pueden incluir:  Contracciones a intervalos regulares (espaciadas de modo regular) que se incrementan en intensidad. Esto puede sentirse como presin o estrechamiento intenso en el abdomen, que se desplaza hacia la espalda. ? Las contracciones pueden sentirse tambin como dolor rtmico en la parte superior de los muslos y la espalda que va y viene a intervalos regulares. ? Para las M.D.C. Holdings primerizas, este cambio en intensidad de las contracciones ocurre generalmente a un ritmo ms gradual. ? Las mujeres que ya han sido madres pueden notar una progresin ms rpida de los cambios de las contracciones.  Una sensacin de presin en el rea vaginal.  Ruptura de la bolsa (ruptura de las [place]). Es cuando el saco de lquido que rodea al beb se rompe. Cuando esto sucede, notar que Liberty Media lquido de la vagina. Este puede ser claro o estar manchado de [place]. Generalmente el trabajo de parto comienza 24 horas despus de la ruptura de [place], [place] puede tomar ms Psychologist, clinical. ? Algunas mujeres notan esto como un chorro de lquido. ? Otras notan que la ropa interior se moja repetidas veces. Siga estas indicaciones en su casa:   Cuando comience el trabajo de parto o si rompe bolsa, llame al mdico o a la lnea de atencin de enfermera. Ellos, en funcin de su situacin, determinarn cundo debe ir a Location manager.  Si entra en trabajo de parto temprano, es posible que pueda descansar y Apple Computer sntomas en su casa. Algunas estrategias para probar en su casa incluyen: ? Tcnicas de respiracin y relajacin. ? Tomar una ducha o un bao de inmersin tibios. ? Optometrist. ? Usar una almohadilla trmica en la espalda para Engineer, materials. Si se le indica que use calor:  Coloque una toalla entre la piel y la fuente de Airline pilot.  Aplique el calor durante 20 a  .  Retire la fuente de calor si la piel se pone de color rojo brillante. Esto es muy importante si no puede Financial risk analyst, calor o fro. Puede correr un riesgo mayor de sufrir quemaduras. Solicite ayuda de inmediato si:  Tiene contracciones dolorosas y regulares cada 5 minutos o menos.  El trabajo de parto comienza antes de que se cumplan las 37 semanas de [place].  Tiene fiebre.  Siente un dolor de cabeza intenso que no se [place].  Elimina cogulos de sangre de color rojo brillante por la vagina.  No siente que el beb se mueva.  Experimenta la aparicin repentina de: ? Dolor de cabeza intenso con problemas de la visin. ? Nuseas, vmitos o diarrea. ? Dolor en el pecho o falta de aire. Estos sntomas pueden Customer service manager. Si el mdico recomienda que vaya al hospital o al centro de nacimientos donde va a dar a luz, no conduzca usted misma. Pdale a otra persona que conduzca o llame a los servicios de emergencia (911 en  los [address] Unidos) Resumen  El trabajo de parto es el proceso natural del cuerpo por el cual se saca al beb, la placenta y el cordn umbilical del tero.  Por lo general, el proceso del Brewertrabajo de parto comienza cuando el embarazo ha llegado a su trmino, entre 37 y 40 semanas de Psychiatristembarazo.  Cuando comience el trabajo de parto o si rompe bolsa, llame al mdico o a la lnea de atencin de enfermera. Ellos, en funcin de su situacin, determinarn cundo debe ir a Location managerhacerse un examen. Esta informacin no tiene Theme park managercomo fin reemplazar el consejo del mdico. Asegrese de hacerle al mdico cualquier pregunta que tenga. Document Released: 12/16/2015 Document Revised: 08/31/2017 Document Reviewed: 08/26/2017 Elsevier Patient Education  2020 ArvinMeritorElsevier Inc. IAC/InterActiveCorpContracciones de Braxton Hicks Braxton Hicks Contractions Las contracciones del tero pueden presentarse durante todo el Moraineembarazo, [place] no siempre indican que la mujer est de [place]-P[place]villeparto. Es posible que usted  haya tenido contracciones de prctica llamadas "contracciones de AlbanyBraxton Hicks". A veces, se las confunde con el parto real. Qu son las contracciones de AyrBraxton Hicks? Las contracciones de RiverdaleBraxton Hicks son espasmos que se producen en los msculos del tero antes del Lakeviewparto. A diferencia de las contracciones del parto verdadero, estas no producen el agrandamiento (la dilatacin) ni el afinamiento del cuello uterino. Hacia el final del embarazo [facility](entre las semanas [phone]), las contracciones de Braxton Hicks pueden presentarse ms seguido y tornarse ms intensas. A veces, resulta difcil distinguirlas del parto verdadero porque pueden ser Murphy Oilmuy molestas. No debe sentirse avergonzada si concurre al hospital con falso parto. En ocasiones, la nica forma de saber si el trabajo de parto es verdadero es que el mdico determine si hay cambios en el cuello del tero. El Office Depotmdico le har un examen fsico y International aid/development workerquizs le controle las contracciones. Si usted no est de Systems developerparto verdadero, el examen debe indicar que el cuello uterino no est dilatado y que usted no ha roto [place]. Si no hay otros problemas de salud asociados con su embarazo, no habr inconvenientes si la envan a su casa con un falso parto. Es posible que las contracciones de Braxton Hicks continen hasta que se desencadene el parto verdadero. Cmo diferenciar el Aleen Campitrabajo de parto falso del verdadero Trabajo de parto verdadero  Las contracciones duran de [place]30a70segundos.  Las contracciones pueden tornarse muy regulares.  La molestia generalmente se siente en la parte superior del tero y se extiende hacia la zona baja del abdomen y Parker Hannifinhacia la cintura.  Las contracciones no desaparecen cuando usted camina.  Las contracciones generalmente se hacen ms intensas y Comptrolleraumentan en frecuencia.  El cuello uterino se dilata y se afina. Parto falso  En general, las contracciones son ms cortas y no tan intensas como las del parto verdadero.  En general, las  contracciones son irregulares.  A menudo, las contracciones se sienten en la parte delantera de la parte baja del abdomen y en la ingle.  Las Futures tradercontracciones pueden desaparecer cuando usted camina o [place] de posicin mientras est Norfolk Islandacostada.  Las contracciones se vuelven ms dbiles y su duracin es menor a medida que transcurre Allied Waste Industriesel tiempo.  En general, el cuello uterino no se dilata ni se afina. Siga estas indicaciones en su casa:   Tome los medicamentos de venta libre y los recetados solamente como se lo haya indicado el mdico.  Contine haciendo los ejercicios habituales y siga las dems indicaciones que el mdico le d.  Coma y beba con moderacin si cree que est  de parto.  Si las contracciones de Dole FoodBraxton Hicks le provocan incomodidad: ? Cambie de posicin: si est acostada o descansando, camine; si est caminando, descanse. ? Sintese y descanse en una baera con agua tibia. ? Beba suficiente lquido como para mantener la orina de color amarillo plido. La deshidratacin puede provocar contracciones. ? Respire lenta y profundamente varias veces por hora.  Vaya a todas las visitas de control prenatales y de control como se lo haya indicado el mdico. Esto es importante. Comunquese con un mdico si:  Tiene fiebre.  Siente dolor constante en el abdomen. Solicite ayuda de inmediato si:  Las contracciones se intensifican, se hacen ms regulares y Arboriculturistcercanas entre s.  Tiene una prdida de lquido por la vagina.  Elimina una mucosidad sanguinolenta (prdida del tapn mucoso).  Tiene una hemorragia vaginal.  Tiene un dolor en la zona lumbar que nunca tuvo antes.  Siente que la cabeza del beb empuja hacia abajo y ejerce presin en la zona plvica.  El beb no se mueve tanto como antes. Resumen  Las State Farmcontracciones que se presentan antes del parto se conocen como contracciones de HomeBraxton Hicks, Californiafalso parto o contracciones de Multimedia programmerprctica.  En general, las contracciones de  [address] StreetBraxton Hicks son ms cortas, ms dbiles, con ms tiempo entre una y Wolf Trapotra, y menos regulares que las contracciones del parto verdadero. Las contracciones del parto verdadero se intensifican progresivamente y se tornan regulares y ms frecuentes.  Para controlar la Longs Drug Storesmolestia que producen las contracciones de NorwoodBraxton Hicks, puede cambiar de posicin, darse un bao templado y Lawyerdescansar, beber mucha agua o practicar la respiracin profunda. Esta informacin no tiene Theme park managercomo fin reemplazar el consejo del mdico. Asegrese de hacerle al mdico cualquier pregunta que tenga. Document Released: 07/13/2017 Document Revised: 03/12/2018 Document Reviewed: 07/13/2017 Elsevier Patient Education  2020 ArvinMeritorElsevier Inc.

## 2019-08-24 NOTE — Progress Notes (Signed)
ROB- doing well, discussed [place], signs of labor, breast feeding, Ready Set Baby info given to review. Discussed BC.never used any form of BC. Lives with Elenor Legato, unsure who will be support person in labor.offered info on volunteer doula services.

## 2019-08-24 NOTE — Progress Notes (Signed)
ROB- cultures obtained, pt is having some pelvic pressure 

## 2019-08-26 LAB — STREP GP B NAA: Strep Gp B NAA: NEGATIVE

## 2019-08-27 LAB — GC/CHLAMYDIA PROBE AMP
Chlamydia trachomatis, NAA: NEGATIVE
Neisseria Gonorrhoeae by PCR: NEGATIVE

## 2019-08-31 ENCOUNTER — Other Ambulatory Visit: Payer: Self-pay

## 2019-08-31 ENCOUNTER — Encounter: Payer: Self-pay | Admitting: Certified Nurse Midwife

## 2019-08-31 ENCOUNTER — Ambulatory Visit (INDEPENDENT_AMBULATORY_CARE_PROVIDER_SITE_OTHER): Payer: Medicaid Other | Admitting: Certified Nurse Midwife

## 2019-08-31 VITALS — BP 115/72 | HR 75 | Wt 162.3 lb

## 2019-08-31 DIAGNOSIS — Z3403 Encounter for supervision of normal first pregnancy, third trimester: Secondary | ICD-10-CM

## 2019-08-31 LAB — POCT URINALYSIS DIPSTICK OB
Bilirubin, UA: NEGATIVE
Blood, UA: NEGATIVE
Glucose, UA: NEGATIVE
Ketones, UA: NEGATIVE
Leukocytes, UA: NEGATIVE
Nitrite, UA: NEGATIVE
POC,PROTEIN,UA: NEGATIVE
Spec Grav, UA: <=1.005 — AB (ref 1.010–1.025)
Urobilinogen, UA: 0.2 E.U./dL
pH, UA: 7 (ref 5.0–8.0)

## 2019-08-31 NOTE — Progress Notes (Signed)
ROB doing well. Feels good movement. Discussed remainder of [place]. Reviewed labor precautions and signs of rupture . Follow up 1 wk with Melody .   Philip Aspen, CNM

## 2019-08-31 NOTE — Patient Instructions (Signed)
Braxton Hicks Contractions Contractions of the uterus can occur throughout pregnancy, but they are not always a sign that you are in labor. You may have practice contractions called Braxton Hicks contractions. These false labor contractions are sometimes confused with true labor. What are Braxton Hicks contractions? Braxton Hicks contractions are tightening movements that occur in the muscles of the uterus before labor. Unlike true labor contractions, these contractions do not result in opening (dilation) and thinning of the cervix. Toward the end of pregnancy (32-34 weeks), Braxton Hicks contractions can happen more often and may become stronger. These contractions are sometimes difficult to tell apart from true labor because they can be very uncomfortable. You should not feel embarrassed if you go to the hospital with false labor. Sometimes, the only way to tell if you are in true labor is for your health care provider to look for changes in the cervix. The health care provider will do a physical exam and may monitor your contractions. If you are not in true labor, the exam should show that your cervix is not dilating and your water has not broken. If there are no other health problems associated with your pregnancy, it is completely safe for you to be sent home with false labor. You may continue to have Braxton Hicks contractions until you go into true labor. How to tell the difference between true labor and false labor True labor  Contractions last 30-70 seconds.  Contractions become very regular.  Discomfort is usually felt in the top of the uterus, and it spreads to the lower abdomen and low back.  Contractions do not go away with walking.  Contractions usually become more intense and increase in frequency.  The cervix dilates and gets thinner. False labor  Contractions are usually shorter and not as strong as true labor contractions.  Contractions are usually irregular.  Contractions  are often felt in the front of the lower abdomen and in the groin.  Contractions may go away when you walk around or change positions while lying down.  Contractions get weaker and are shorter-lasting as time goes on.  The cervix usually does not dilate or become thin. Follow these instructions at home:   Take over-the-counter and prescription medicines only as told by your health care provider.  Keep up with your usual exercises and follow other instructions from your health care provider.  Eat and drink lightly if you think you are going into labor.  If Braxton Hicks contractions are making you uncomfortable: ? Change your position from lying down or resting to walking, or change from walking to resting. ? Sit and rest in a tub of warm water. ? Drink enough fluid to keep your urine pale yellow. Dehydration may cause these contractions. ? Do slow and deep breathing several times an hour.  Keep all follow-up prenatal visits as told by your health care provider. This is important. Contact a health care provider if:  You have a fever.  You have continuous pain in your abdomen. Get help right away if:  Your contractions become stronger, more regular, and closer together.  You have fluid leaking or gushing from your vagina.  You pass blood-tinged mucus (bloody show).  You have bleeding from your vagina.  You have low back pain that you never had before.  You feel your baby's head pushing down and causing pelvic pressure.  Your baby is not moving inside you as much as it used to. Summary  Contractions that occur before labor are   called Braxton Hicks contractions, false labor, or practice contractions.  Braxton Hicks contractions are usually shorter, weaker, farther apart, and less regular than true labor contractions. True labor contractions usually become progressively stronger and regular, and they become more frequent.  Manage discomfort from Braxton Hicks contractions  by changing position, resting in a warm bath, drinking plenty of water, or practicing deep breathing. This information is not intended to replace advice given to you by your health care provider. Make sure you discuss any questions you have with your health care provider. Document Released: 04/16/2017 Document Revised: 11/13/2017 Document Reviewed: 04/16/2017 Elsevier Patient Education  2020 [employer]  

## 2019-08-31 NOTE — Addendum Note (Signed)
Addended by: Raliegh Ip on: 08/31/2019 02:40 PM   Modules accepted: Orders

## 2019-09-03 ENCOUNTER — Inpatient Hospital Stay: Payer: Medicaid Other

## 2019-09-03 ENCOUNTER — Inpatient Hospital Stay
Admission: EM | Admit: 2019-09-03 | Discharge: 2019-09-05 | DRG: 833 | Disposition: A | Discharge status: Home / Self Care | Payer: Medicaid Other | Attending: Certified Nurse Midwife | Admitting: Certified Nurse Midwife

## 2019-09-03 ENCOUNTER — Other Ambulatory Visit: Payer: Self-pay

## 2019-09-03 DIAGNOSIS — R509 Fever, unspecified: Secondary | ICD-10-CM

## 2019-09-03 DIAGNOSIS — Z3A37 37 weeks gestation of pregnancy: Secondary | ICD-10-CM

## 2019-09-03 DIAGNOSIS — O2303 Infections of kidney in pregnancy, third trimester: Principal | ICD-10-CM | POA: Diagnosis present

## 2019-09-03 DIAGNOSIS — O26893 Other specified pregnancy related conditions, third trimester: Secondary | ICD-10-CM

## 2019-09-03 DIAGNOSIS — Z20828 Contact with and (suspected) exposure to other viral communicable diseases: Secondary | ICD-10-CM | POA: Diagnosis present

## 2019-09-03 DIAGNOSIS — R109 Unspecified abdominal pain: Secondary | ICD-10-CM

## 2019-09-03 DIAGNOSIS — R1032 Left lower quadrant pain: Secondary | ICD-10-CM

## 2019-09-03 DIAGNOSIS — O2300 Infections of kidney in pregnancy, unspecified trimester: Secondary | ICD-10-CM | POA: Diagnosis present

## 2019-09-03 LAB — COMPREHENSIVE METABOLIC PANEL
ALT: 23 U/L (ref 0–44)
AST: 24 U/L (ref 15–41)
Albumin: 2.9 g/dL — ABNORMAL LOW (ref 3.5–5.0)
Alkaline Phosphatase: 196 U/L — ABNORMAL HIGH (ref 38–126)
Anion gap: 9 (ref 5–15)
BUN: 8 mg/dL (ref 6–20)
CO2: 23 mmol/L (ref 22–32)
Calcium: 8.6 mg/dL — ABNORMAL LOW (ref 8.9–10.3)
Chloride: 103 mmol/L (ref 98–111)
Creatinine, Ser: 0.59 mg/dL (ref 0.44–1.00)
GFR calc Af Amer: >60 mL/min (ref >60)
GFR calc non Af Amer: >60 mL/min (ref >60)
Glucose, Bld: 118 mg/dL — ABNORMAL HIGH (ref 70–99)
Potassium: 3.6 mmol/L (ref 3.5–5.1)
Sodium: 135 mmol/L (ref 135–145)
Total Bilirubin: 0.9 mg/dL (ref 0.3–1.2)
Total Protein: 6.7 g/dL (ref 6.5–8.1)

## 2019-09-03 LAB — URINALYSIS, ROUTINE W REFLEX MICROSCOPIC
Bilirubin Urine: NEGATIVE
Glucose, UA: 50 mg/dL — AB
Ketones, ur: NEGATIVE mg/dL
Nitrite: NEGATIVE
Protein, ur: 30 mg/dL — AB
Specific Gravity, Urine: 1.008 (ref 1.005–1.030)
Squamous Epithelial / HPF: NONE SEEN (ref 0–5)
WBC, UA: >50 WBC/hpf — ABNORMAL HIGH (ref 0–5)
pH: 7 (ref 5.0–8.0)

## 2019-09-03 LAB — CBC
HCT: 35 % — ABNORMAL LOW (ref 36.0–46.0)
Hemoglobin: 11.9 g/dL — ABNORMAL LOW (ref 12.0–15.0)
MCH: 30.1 pg (ref 26.0–34.0)
MCHC: 34 g/dL (ref 30.0–36.0)
MCV: 88.4 fL (ref 80.0–100.0)
Platelets: 234 K/uL (ref 150–400)
RBC: 3.96 MIL/uL (ref 3.87–5.11)
RDW: 13.9 % (ref 11.5–15.5)
WBC: 18.9 K/uL — ABNORMAL HIGH (ref 4.0–10.5)
nRBC: 0 % (ref 0.0–0.2)

## 2019-09-03 LAB — SARS CORONAVIRUS 2 BY RT PCR (HOSPITAL ORDER, PERFORMED IN ~~LOC~~ HOSPITAL LAB): SARS Coronavirus 2: NEGATIVE

## 2019-09-03 MED ORDER — DOCUSATE SODIUM 100 MG PO CAPS
100.0000 mg | ORAL_CAPSULE | Freq: Every day | ORAL | Status: DC
  Administered 2019-09-04: 11:00:00 100 mg via ORAL
  Filled 2019-09-03: qty 1

## 2019-09-03 MED ORDER — MORPHINE SULFATE (PF) 4 MG/ML IV SOLN
4.0000 mg | INTRAVENOUS | Status: DC | PRN
  Administered 2019-09-03 – 2019-09-04 (×3): 4 mg via INTRAVENOUS
  Filled 2019-09-03 (×3): qty 1

## 2019-09-03 MED ORDER — SODIUM CHLORIDE 0.9 % IV SOLN
2.0000 g | INTRAVENOUS | Status: DC
  Administered 2019-09-03 – 2019-09-05 (×3): 2 g via INTRAVENOUS
  Filled 2019-09-03: qty 20
  Filled 2019-09-03 (×3): qty 2

## 2019-09-03 MED ORDER — AMOXICILLIN 500 MG PO CAPS
500.0000 mg | ORAL_CAPSULE | Freq: Three times a day (TID) | ORAL | Status: DC
  Administered 2019-09-03: 500 mg via ORAL
  Filled 2019-09-03: qty 1

## 2019-09-03 MED ORDER — HYDROMORPHONE HCL 1 MG/ML IJ SOLN: 1.0000 mg | INTRAMUSCULAR | Status: DC | PRN

## 2019-09-03 MED ORDER — ZOLPIDEM TARTRATE 5 MG PO TABS: 5.0000 mg | ORAL_TABLET | Freq: Every evening | ORAL | Status: DC | PRN

## 2019-09-03 MED ORDER — ACETAMINOPHEN 500 MG PO TABS
1000.0000 mg | ORAL_TABLET | Freq: Four times a day (QID) | ORAL | Status: DC
  Administered 2019-09-03 – 2019-09-05 (×8): 1000 mg via ORAL
  Filled 2019-09-03 (×8): qty 2

## 2019-09-03 MED ORDER — NITROFURANTOIN MONOHYD MACRO 100 MG PO CAPS
100.0000 mg | ORAL_CAPSULE | Freq: Two times a day (BID) | ORAL | Status: DC
  Administered 2019-09-03 – 2019-09-05 (×5): 100 mg via ORAL
  Filled 2019-09-03 (×6): qty 1

## 2019-09-03 MED ORDER — ONDANSETRON HCL 4 MG/2ML IJ SOLN
4.0000 mg | Freq: Four times a day (QID) | INTRAMUSCULAR | Status: DC | PRN
  Administered 2019-09-05: 4 mg via INTRAVENOUS
  Filled 2019-09-03: qty 2

## 2019-09-03 MED ORDER — ACETAMINOPHEN 500 MG PO TABS
1000.0000 mg | ORAL_TABLET | Freq: Four times a day (QID) | ORAL | Status: DC | PRN
  Administered 2019-09-03 (×2): 1000 mg via ORAL
  Filled 2019-09-03: qty 2

## 2019-09-03 MED ORDER — PRENATAL MULTIVITAMIN CH
1.0000 | ORAL_TABLET | Freq: Every day | ORAL | Status: DC
  Administered 2019-09-04 – 2019-09-05 (×2): 1 via ORAL
  Filled 2019-09-03 (×2): qty 1

## 2019-09-03 MED ORDER — LACTATED RINGERS IV SOLN
INTRAVENOUS | Status: DC
  Administered 2019-09-03 – 2019-09-05 (×5): via INTRAVENOUS

## 2019-09-03 MED ORDER — CALCIUM CARBONATE ANTACID 500 MG PO CHEW: 2.0000 | CHEWABLE_TABLET | ORAL | Status: DC | PRN

## 2019-09-03 MED ORDER — ACETAMINOPHEN 500 MG PO TABS
ORAL_TABLET | ORAL | Status: AC
  Filled 2019-09-03: qty 2

## 2019-09-03 NOTE — Progress Notes (Addendum)
Patient ID: Krista Garcia, female   DOB: 05/05/1996, 23 y.o.   MRN: [id number]  Krista Garcia is a 23 y.o. G1P0 at 7490w6d who is admitted for management for fever and left flank pain for the last three (3) days, rule out pyelonephritis and kidney stones.  Estimated Date of Delivery: 09/18/19  Fetal presentation is cephalic.  Length of Stay:  0 Days. Admitted 09/03/2019  Subjective:  Patient states she "does not feel well". Notes sharp pain starting in her left mid-back that radiates around to the front, occurring intermittently for the last two (2) to three (3) days accompanied by low grade fever and pain with urination.   Patient reports good fetal movement.  She reports irregular uterine contractions, no bleeding and no loss of fluid per vagina.  Denies difficulty breathing or respiratory distress, chest pain, and leg pain or swelling.   Review of Systems:  ROS negative except as noted above. Information obtained from patient and spouse with the use on in-house interpreter.   Objective:   Temp:  [98.7 F (37.1 C)-99.6 F (37.6 C)] 99.6 F (37.6 C) (09/19 1143) Pulse Rate:  [106] 106 (09/19 0451) Resp:  [18] 18 (09/19 0451) BP: (117)/(17) 117/17 (09/19 0451)  Physical Examination:  CONSTITUTIONAL: Well-developed, well-nourished female in no acute distress.   SKIN: Skin is hot and clammy. No rash noted. Diaphoretic. No erythema. No pallor.  NEUROLGIC: Alert and oriented to person, place, and time. Normal reflexes, muscle tone coordination. No cranial nerve deficit noted.  PSYCHIATRIC: Normal mood and affect. Normal behavior. Normal judgment and thought content.  CARDIOVASCULAR: Normal heart rate noted, regular rhythm  RESPIRATORY: Effort and breath sounds normal, no problems with respiration noted  MUSCULOSKELETAL: Normal range of motion. No edema and no tenderness. 2+ distal pulses.  ABDOMEN: Soft, nontender, nondistended, gravid. Positive left CVAT.    CERVIX: Dilation: 1 Exam by:: Moe Graca, CNM  Fetal wellbeing: FHR Category I  Uterine activity: Irregular, soft resting tone  Results for orders placed or performed during the hospital encounter of 09/03/19 (from the past 48 hour(s))  Urinalysis, Routine w reflex microscopic     Status: Abnormal   Collection Time: 09/03/19  5:23 AM  Result Value Ref Range   Color, Urine YELLOW (A) YELLOW   APPearance CLOUDY (A) CLEAR   Specific Gravity, Urine 1.008 1.005 - 1.030   pH 7.0 5.0 - 8.0   Glucose, UA 50 (A) NEGATIVE mg/dL   Hgb urine dipstick MODERATE (A) NEGATIVE   Bilirubin Urine NEGATIVE NEGATIVE   Ketones, ur NEGATIVE NEGATIVE mg/dL   Protein, ur 30 (A) NEGATIVE mg/dL   Nitrite NEGATIVE NEGATIVE   Leukocytes,Ua LARGE (A) NEGATIVE   RBC / HPF 11-20 0 - 5 RBC/hpf   WBC, UA >50 (H) 0 - 5 WBC/hpf   Bacteria, UA RARE (A) NONE SEEN   Squamous Epithelial / LPF NONE SEEN 0 - 5   WBC Clumps PRESENT    Mucus PRESENT    Non Squamous Epithelial PRESENT (A) NONE SEEN    Comment: Performed at [facility], [address] [place], [place] [id number]  CBC     Status: Abnormal   Collection Time: 09/03/19  9:53 AM  Result Value Ref Range   WBC 18.9 (H) 4.0 - 10.5 K/uL   RBC 3.96 3.87 - 5.11 MIL/uL   Hemoglobin 11.9 (L) 12.0 - 15.0 g/dL   HCT 19.135.0 (L) 47.836.0 - 29.546.0 %   MCV 88.4 80.0 - 100.0 fL  MCH 30.1 26.0 - 34.0 pg   MCHC 34.0 30.0 - 36.0 g/dL   RDW 13.9 11.5 - 15.5 %   Platelets 234 150 - 400 K/uL   nRBC 0.0 0.0 - 0.2 %    Comment: Performed at [facility], [address] [place], [place] [address]  Comprehensive metabolic panel     Status: Abnormal   Collection Time: 09/03/19  9:53 AM  Result Value Ref Range   Sodium 135 135 - 145 mmol/L   Potassium 3.6 3.5 - 5.1 mmol/L   Chloride 103 98 - 111 mmol/L   CO2 23 22 - 32 mmol/L   Glucose, Bld 118 (H) 70 - 99 mg/dL   BUN 8 6 - 20 mg/dL   Creatinine, Ser 0.59 0.44 - 1.00 mg/dL   Calcium 8.6 (L) 8.9 - 10.3 mg/dL    Total Protein 6.7 6.5 - 8.1 g/dL   Albumin 2.9 (L) 3.5 - 5.0 g/dL   AST 24 15 - 41 U/L   ALT 23 0 - 44 U/L   Alkaline Phosphatase 196 (H) 38 - 126 U/L   Total Bilirubin 0.9 0.3 - 1.2 mg/dL   GFR calc non Af Amer >60 >60 mL/min   GFR calc Af Amer >60 >60 mL/min   Anion gap 9 5 - 15    Comment: Performed at [facility], [address], [place], [place] [address]  SARS Coronavirus 2 [facility] order, Performed in [facility] hospital lab) Nasopharyngeal Nasopharyngeal Swab     Status: None   Collection Time: 09/03/19  9:53 AM   Specimen: Nasopharyngeal Swab  Result Value Ref Range   SARS Coronavirus 2 NEGATIVE NEGATIVE    Comment: (NOTE) If result is NEGATIVE SARS-CoV-2 target nucleic acids are NOT DETECTED. The SARS-CoV-2 RNA is generally detectable in upper and lower  respiratory specimens during the acute phase of infection. The lowest  concentration of SARS-CoV-2 viral copies this assay can detect is 250  copies / mL. A negative result does not preclude SARS-CoV-2 infection  and should not be used as the sole basis for treatment or other  patient management decisions.  A negative result may occur with  improper specimen collection / handling, submission of specimen other  than nasopharyngeal swab, presence of viral mutation(s) within the  areas targeted by this assay, and inadequate number of viral copies  (<250 copies / mL). A negative result must be combined with clinical  observations, patient history, and epidemiological information. If result is POSITIVE SARS-CoV-2 target nucleic acids are DETECTED. The SARS-CoV-2 RNA is generally detectable in upper and lower  respiratory specimens dur ing the acute phase of infection.  Positive  results are indicative of active infection with SARS-CoV-2.  Clinical  correlation with patient history and other diagnostic information is  necessary to determine patient infection status.  Positive results do  not rule out bacterial  infection or co-infection with other viruses. If result is PRESUMPTIVE POSTIVE SARS-CoV-2 nucleic acids MAY BE PRESENT.   A presumptive positive result was obtained on the submitted specimen  and confirmed on repeat testing.  While 2019 novel coronavirus  (SARS-CoV-2) nucleic acids may be present in the submitted sample  additional confirmatory testing may be necessary for epidemiological  and / or clinical management purposes  to differentiate between  SARS-CoV-2 and other Sarbecovirus currently known to infect humans.  If clinically indicated additional testing with an alternate test  methodology [phone]) is advised. The SARS-CoV-2 RNA is generally  detectable in  upper and lower respiratory sp ecimens during the acute  phase of infection. The expected result is Negative. Fact Sheet for Patients:  BoilerBrush.com.cy Fact Sheet for Healthcare Providers: https://pope.com/ This test is not yet approved or cleared by the [place] FDA and has been authorized for detection and/or diagnosis of SARS-CoV-2 by FDA under an Emergency Use Authorization (EUA).  This EUA will remain in effect (meaning this test can be used) for the duration of the COVID-19 declaration under Section 564(b)(1) of the Act, 21 U.S.C. section 360bbb-3(b)(1), unless the authorization is terminated or revoked sooner. Performed at [facility], [address] [place], [place] [address]     No results found.  Current scheduled medications . acetaminophen      . nitrofurantoin (macrocrystal-monohydrate)  100 mg Oral Q12H    I have reviewed the patient's current medications.  ASSESSMENT: Patient Active Problem List   Diagnosis Date Noted  . Indication for care in labor and delivery, antepartum 09/03/2019  . Pregnancy 06/28/2019    PLAN:  Labs and renal ultrasound, see orders  Discontinue Amoxicillin. Start IV fluids, Rocephin and Macrobid; see  orders.   Plan of care discussed with patient and spouse via interpreter, verbalized understanding.   Reviewed red flag symptoms and when to call.   Continue orders as written. Reassess as needed.   Update given to Dr. Valentino Saxon.    Gunnar Bulla, CNM [facility], [facility] 09/03/19 10:36 AM

## 2019-09-03 NOTE — Progress Notes (Signed)
Subjective:  Patient states she is "feeling better" since receiving pain medications. Able to tolerate food and drink by mouth. FOB at bedside for continuous support.   Denies difficulty breathing or respiratory distress, chest pain, abdominal pain, vaginal bleeding, dysuria, and leg pain or swelling.   I have reviewed patient's vital signs, intake and output, medications, labs and radiology results.  Objective:  Temp:  [98.7 F (37.1 C)-100.8 F (38.2 C)] 99.8 F (37.7 C) (09/19 1316) Pulse Rate:  [100-116] 100 (09/19 1616) Resp:  [18] 18 (09/19 0451) BP: (104-122)/(17-87) 104/58 (09/19 1616) SpO2:  [97 %-99 %] 99 % (09/19 1205)  Physical Exam:  General: alert and cooperative Resp: clear to auscultation bilaterally Cardio: normal apical impulse Vaginal Bleeding: none   Fetal wellbeing: Category I  Contractions: Occasional, soft resting tone  Assessment:  Krista Garcia is a 23 y.o. G1P0 at [redacted]w[redacted]d who is admitted for treatment of pyelonephritis, Rh positive, GBS negative  FHR Category I  Plan:  Plan of care discussed with patient and spouse with the use of an interpreter, verbalized understanding. All questions answered.   Transfer patient to floor, see orders.   Reviewed red flag symptoms, signs of labor, and when to call.   Continue orders as written. Reassess as needed.    LOS: 0 days    Diona Fanti, CNM [facility], [facility] 09/03/2019, 4:30 PM

## 2019-09-03 NOTE — OB Triage Provider Note (Signed)
Krista Garcia is a 23 y.o. G1P0 at [redacted]w[redacted]d who is admitted for left flank pain and fever for 2 days.  Estimated Date of Delivery: 09/18/19 Fetal presentation is cephalic.  Length of Stay:  0 Days. Admitted 09/03/2019  Subjective: Reports sharp pain that starts on left mid-back and radiates around to the front, occurring intermittently for the last two days, with a low grade fever. 99.1 on admission. Also reports burning with urination. Patient reports good fetal movement.  She reports no known uterine contractions, no bleeding and no loss of fluid per vagina.  Vitals:  Blood pressure (!) 117/17, pulse (!) 106, temperature 99.1 F (37.3 C), temperature source Oral, resp. rate 18. Physical Examination: CONSTITUTIONAL: Well-developed, well-nourished female in no acute distress.  SKIN: Skin is warm and dry. No rash noted. Not diaphoretic. No erythema. No pallor. [place]: Alert and oriented to person, place, and time. Normal reflexes, muscle tone coordination. No cranial nerve deficit noted. PSYCHIATRIC: Normal mood and affect. Normal behavior. Normal judgment and thought content. CARDIOVASCULAR: Normal heart rate noted, regular rhythm RESPIRATORY: Effort and breath sounds normal, no problems with respiration noted MUSCULOSKELETAL: Normal range of motion. No edema and no tenderness. 2+ distal pulses. ABDOMEN: Soft, nontender, nondistended, gravid. CERVIX:  not checked yet  Fetal monitoring: FHR: 160 bpm, Variability: moderate, Accelerations: Present, Decelerations: Absent  Uterine activity: 4-5 contractions per hour, mild  And not noticable to patient  No results found for this or any previous visit (from the past 48 hour(s)).  No results found.  Current scheduled medications   I have reviewed the patient's current medications. PNV and tylenol  ASSESSMENT: Patient Active Problem List   Diagnosis Date Noted  . Pregnancy 06/28/2019    PLAN: Urine sent for culture, tylenol  given and po hydrated. PO antibiotics started, will consider IVF if needed. OK to have regular diet.  Will monitor for a few hours.   [place], CNM [facility]

## 2019-09-03 NOTE — OB Triage Note (Signed)
Pt 23 yo, G1P0, [redacted]w[redacted]d, presents w/ complaints of sharp pain in left side, difficulty urinating, and burning when urinating. Reports pain as 9/10 that began 2 days ago. Denies LOF, vaginal bleeding. Feels baby moving, Does not report contractions. Reports she has had a fever the past two days that is relieved with Tylenol. Monitors applied and assessing.

## 2019-09-04 DIAGNOSIS — O26893 Other specified pregnancy related conditions, third trimester: Secondary | ICD-10-CM

## 2019-09-04 DIAGNOSIS — Z3A37 37 weeks gestation of pregnancy: Secondary | ICD-10-CM

## 2019-09-04 DIAGNOSIS — R109 Unspecified abdominal pain: Secondary | ICD-10-CM

## 2019-09-04 LAB — TYPE AND SCREEN
ABO/RH(D): O POS
Antibody Screen: NEGATIVE

## 2019-09-04 LAB — COMPREHENSIVE METABOLIC PANEL
ALT: 20 U/L (ref 0–44)
AST: 20 U/L (ref 15–41)
Albumin: 2.8 g/dL — ABNORMAL LOW (ref 3.5–5.0)
Alkaline Phosphatase: 208 U/L — ABNORMAL HIGH (ref 38–126)
Anion gap: 9 (ref 5–15)
BUN: <5 mg/dL — ABNORMAL LOW (ref 6–20)
CO2: 23 mmol/L (ref 22–32)
Calcium: 8.6 mg/dL — ABNORMAL LOW (ref 8.9–10.3)
Chloride: 104 mmol/L (ref 98–111)
Creatinine, Ser: 0.52 mg/dL (ref 0.44–1.00)
GFR calc Af Amer: >60 mL/min (ref >60)
GFR calc non Af Amer: >60 mL/min (ref >60)
Glucose, Bld: 102 mg/dL — ABNORMAL HIGH (ref 70–99)
Potassium: 4.1 mmol/L (ref 3.5–5.1)
Sodium: 136 mmol/L (ref 135–145)
Total Bilirubin: 0.5 mg/dL (ref 0.3–1.2)
Total Protein: 6.5 g/dL (ref 6.5–8.1)

## 2019-09-04 LAB — CBC
HCT: 37 % (ref 36.0–46.0)
Hemoglobin: 12.1 g/dL (ref 12.0–15.0)
MCH: 29.5 pg (ref 26.0–34.0)
MCHC: 32.7 g/dL (ref 30.0–36.0)
MCV: 90.2 fL (ref 80.0–100.0)
Platelets: 214 10*3/uL (ref 150–400)
RBC: 4.1 MIL/uL (ref 3.87–5.11)
RDW: 13.9 % (ref 11.5–15.5)
WBC: 15 10*3/uL — ABNORMAL HIGH (ref 4.0–10.5)
nRBC: 0 % (ref 0.0–0.2)

## 2019-09-04 MED ORDER — POLYETHYLENE GLYCOL 3350 17 G PO PACK
17.0000 g | PACK | Freq: Every day | ORAL | Status: DC
  Administered 2019-09-04 – 2019-09-05 (×2): 17 g via ORAL
  Filled 2019-09-04 (×2): qty 1

## 2019-09-04 MED ORDER — DOCUSATE SODIUM 100 MG PO CAPS
100.0000 mg | ORAL_CAPSULE | Freq: Two times a day (BID) | ORAL | Status: DC
  Administered 2019-09-04 – 2019-09-05 (×2): 100 mg via ORAL
  Filled 2019-09-04 (×2): qty 1

## 2019-09-04 MED ORDER — OXYCODONE HCL 5 MG PO TABS
5.0000 mg | ORAL_TABLET | ORAL | Status: DC | PRN
  Administered 2019-09-05 (×2): 5 mg via ORAL
  Filled 2019-09-04 (×2): qty 1

## 2019-09-04 NOTE — Discharge Instructions (Signed)
Dolor en la dolor en la fosa lumbar en adultos Flank Pain, Adult El dolor en la fosa lumbar es aquel dolor que se siente en un lado del cuerpo. El flanco es la zona que se localiza en un lado del cuerpo, entre la parte superior del vientre (abdomen) y la espalda. El Software engineer en un perodo corto de [place] o puede durar mucho tiempo o reaparecer con frecuencia (crnico). Puede ser leve o muy intenso. El dolor en esta zona puede tener diferentes causas. Siga estas indicaciones en su casa:   Beba suficiente lquido como para mantener el pis (orina) claro o de color amarillo plido.  Haga reposo como se lo haya indicado el mdico.  Tome los medicamentos de venta libre y los recetados solamente como se lo haya indicado el mdico.  Realice un seguimiento por escrito de lo siguiente: ? Lo que le caus dolor en la fosa lumbar. ? Lo que lo alivi.  Concurra a todas las visitas de [address] se lo haya indicado el mdico. Esto es importante. Comunquese con un mdico si:  Los medicamentos no Tourist information centre manager.  Aparecen nuevos sntomas.  El dolor [place].  Tiene fiebre.  Los sntomas duran ms de 2 a 3das.  Tiene dificultad para orinar.  Orina con ms frecuencia que lo normal. Solicite ayuda de inmediato si:  Tiene dificultad para respirar.  Le falta el aire.  Le duele el vientre, o este est hinchado o enrojecido.  Siente malestar estomacal (nuseas).  Vomita.  Siente que se desvanecer o pierde el conocimiento (se desmaya).  Observa sangre en la orina. Resumen  El dolor en la fosa lumbar es aquel dolor que se siente en un lado del cuerpo. El flanco es la zona que se localiza en un lado del cuerpo, entre la parte superior del vientre (abdomen) y la espalda.  El dolor en la fosa lumbar puede aparecer en un perodo corto de tiempo (agudo) o puede durar mucho tiempo o reaparecer con frecuencia (crnico). Puede ser leve o muy intenso.  El dolor en  esta zona puede tener diferentes causas.  Comunquese con su mdico si los sntomas empeoran o si duran ms de 2 a 3 das. Esta informacin no tiene Theme park manager el consejo del mdico. Asegrese de hacerle al mdico cualquier pregunta que tenga. Document Released: 08/25/2012 Document Revised: 06/09/2017 Document Reviewed: 06/09/2017 Elsevier Patient Education  2020 Elsevier Inc.  Pielonefritis en los adultos Pyelonephritis, Adult  La pielonefritis es una infeccin que se produce en el rin. Los riones son los rganos que ayudan a limpiar la sangre al [employer] residuos a travs de la Pulcifer. Esta infeccin puede curarse rpidamente o durar [employer]. En la [employer], desaparece con el tratamiento y no causa otros problemas. Cules son las causas? Esta afeccin puede ser causada por lo siguiente:  Grmenes (bacterias) que se trasladan de la vejiga al rin. Esto puede suceder despus de tener una infeccin en la vejiga.  Grmenes que se trasladan de la sangre al rin. Qu incrementa el riesgo? Es ms probable que esta afeccin se manifieste en:  Mujeres embarazadas.  Personas de edad avanzada.  Personas que tienen alguna de estas afecciones: ? Diabetes. ? Inflamacin de la prstata (prostatitis) en los hombres. ? Clculos renales o en la vejiga. ? Otros problemas en el rin o en las partes del cuerpo que transportan la orina desde los riones hasta la vejiga (urteres). ? Cncer.  Las [employer]  tienen un tubo delgado y pequeo (catter) colocado en la vejiga.  Las personas que son sexualmente activas.  Las mujeres que usan un medicamento que [employer] espermatozoides (espermicida) para Location manager.  Las personas que han tenido una infeccin urinaria (IU) previa. Cules son los signos o los sntomas? Los sntomas de esta afeccin incluyen:  Hacer pis con frecuencia.  Necesidad intensa de Geographical information systems officer de inmediato.  Sensacin de ardor o  escozor al [employer].  Dolor abdominal.  Dolor de espalda.  Dolor al costado del cuerpo (fosa lumbar).  Fiebre o escalofros.  Sangre en la [place].  Malestar estomacal (nuseas) o ganas de devolver (vmitos). Cmo se trata? El tratamiento para esta afeccin puede incluir lo siguiente:  Tomar antibiticos por boca (va oral).  Beber la cantidad suficiente de lquido. Si la infeccin es grave, es posible que Agricultural consultant hospital. Pueden darle antibiticos y lquidos que se colocan directamente en una vena a travs de un tubo (catter) intravenoso. En algunos casos, pueden necesitarse otros tratamientos. Siga estas instrucciones en su casa: Medicamentos  Tome los [employer] se lo haya indicado el mdico. No deje de tomar el antibitico aunque comience a sentirse mejor.  Tome los medicamentos de venta libre y los recetados solamente como se lo haya indicado el mdico. Instrucciones generales   Beba suficiente lquido como para Pharmacologist la orina de color amarillo plido.  Evite la cafena, el t y las [address]  Orine con frecuencia. Evite retener la orina durante largos perodos.  Orine antes y despus de las [employer].  Despus de las deposiciones, las mujeres deben higienizarse desde adelante hacia atrs. Use slo un papel tissue por vez.  Concurra a todas las visitas de [address] se lo haya indicado el mdico. Esto es importante. Comunquese con un mdico si:  No mejora luego de 2 das.  Sus sntomas empeoran.  Tiene fiebre. Solicite ayuda inmediatamente si:  No puede tomar los medicamentos o beber lquidos segn las indicaciones.  Siente escalofros o comienza a Secretary/administrator.  Vomita.  Tiene un dolor muy intenso en el costado o en la espalda.  Se siente muy dbil o se desvanece (se desmaya). Resumen  La pielonefritis es una infeccin que se produce en el rin.  En la [employer], esta infeccin  desaparece con el tratamiento y no causa otros problemas.  Tome los antibiticos como se lo haya indicado el mdico. No deje de tomar el antibitico aunque comience a sentirse mejor.  Beba suficiente lquido como para Pharmacologist la orina de color amarillo plido. Esta informacin no tiene Theme park manager el consejo del mdico. Asegrese de hacerle al mdico cualquier pregunta que tenga. Document Released: 12/01/2005 Document Revised: 11/12/2018 Document Reviewed: 11/12/2018 Elsevier Patient Education  2020 [employer].  Nitrofurantoin tablets or capsules Qu es este medicamento? La NITROFURANTONA es un antibitico. Se utiliza en el tratamiento de la infecciones del tracto urinario. Este medicamento puede ser utilizado para otros usos; si tiene alguna pregunta consulte con su proveedor de atencin mdica o con su farmacutico. MARCAS COMUNES: Macrobid, Macrodantin, Urotoin Armed forces operational officer a mi profesional de la salud antes de tomar este medicamento? Necesita saber si usted presenta alguno de los [employer] o situaciones:  anemia  diabetes  deficiencia de glucosa-6-fosfato deshidrogenasa  enfermedad renal  enfermedad heptica  enfermedad pulmonar  otras enfermedades crnicas  una reaccin alrgica o inusual a la nitrofurantona, a otros antibiticos, a otros medicamentos, alimentos, colorantes  o conservantes  si est embarazada o buscando quedar embarazada  si est amamantando a un beb Cmo debo utilizar este medicamento? Tome este medicamento por va oral con un vaso de agua. Siga las instrucciones de la etiqueta del [place]. Tome este medicamento con leche o con alimentos. Tome sus dosis a intervalos regulares. No tome su medicamento con una frecuencia mayor que la indicada. No deje de tomarlo excepto si as lo indica su mdico. Hable con su pediatra para informarse acerca del uso de este medicamento en nios. Aunque este medicamento se puede recetar  para condiciones selectivas, las precauciones se aplican. Sobredosis: Pngase en contacto inmediatamente con un centro toxicolgico o una sala de urgencia si usted cree que haya tomado demasiado medicamento. ATENCIN: [employer] es solo para usted. No comparta este medicamento con nadie. Qu sucede si me olvido de una dosis? Si olvida una dosis, tmela lo antes posible. Si es casi la hora de la prxima dosis, tome slo esa dosis. No tome dosis adicionales o dobles. Qu puede interactuar con este medicamento?  anticidos que contienen trisilicato de magnesio  probenecid  antibiticos quinolnicos, tales como ciprofloxacina, lomefloxacino, norfloxacino y Designer, jewellery ser que esta lista no menciona todas las posibles interacciones. Informe a su profesional de [employer] de [employer] productos a base de hierbas, medicamentos de [place] o suplementos nutritivos que est tomando. Si usted fuma, consume bebidas alcohlicas o si utiliza drogas ilegales, indqueselo tambin a su profesional de [employer]. Algunas sustancias pueden interactuar con su medicamento. A qu debo estar atento al usar [employer]? Si los sntomas no mejoran o si experimenta nuevos sntomas, consulte con su mdico o su profesional de [employer]. Beba varios vasos de Warehouse manager. Si toma este medicamento durante un perodo de [employer], debe visitar a su mdico para chequear su evolucin peridicamente. Si es diabtico, podr Barista un resultado positivo falso en los [employer] de determinacin del nivel de azcar en la orina con algunas marcas de [place] de [place]. Consulte con su mdico. Qu efectos secundarios puedo tener al [employer] este medicamento? Efectos secundarios que debe informar a su mdico o a Producer, television/film/video de la salud tan pronto como sea posible:  Therapist, art como erupcin cutnea o urticarias, hinchazn de la cara, labios o Tree surgeon en el  pecho  tos  dificultad al respirar  [employer], somnolencia  fiebre o infeccin  molestias o dolores articulares  piel plida o teida azul  enrojecimiento, formacin de ampollas, descamacin o aflojamiento de la piel, inclusive dentro de la boca  hormigueo, ardor, Engineer, mining o entumecimiento de las manos o los pies  sangrado o magulladuras inusuales  cansancio o debilidad inusual  color amarillento de ojos o piel Efectos secundarios que, por lo general, no requieren Psychologist, prison and probation services (debe informarlos a su mdico o a su profesional de la salud si persisten o si son molestos):  orina de color amarillo oscuro  diarrea  dolor de cabeza  prdida del apetito  nuseas o vmitos  prdida del cabello temporal Puede ser que esta lista no menciona todos los posibles efectos secundarios. Comunquese a su mdico por asesoramiento mdico [employer]. Usted puede informar los efectos secundarios a la FDA por telfono al 1-800-FDA-1088. Dnde debo guardar mi medicina? Mantngala fuera del alcance de los nios. Gurdela a [employer], entre 15 y 30 grados C (43 y 5 grados F). Protjala de la luz. Deseche todo el medicamento que no haya utilizado, despus  de la fecha de vencimiento. ATENCIN: Este folleto es un resumen. Puede ser que no cubra toda la posible informacin. Si usted tiene preguntas acerca de esta medicina, consulte con su mdico, su farmacutico o su profesional de Radiographer, therapeuticla salud.  2020 Elsevier/Gold Standard (2015-01-23 00:00:00)   Third Trimester of Pregnancy  The third trimester is from week 28 through week 40 (months 7 through 9). This trimester is when your unborn baby (fetus) is growing very fast. At the end of the ninth month, the unborn baby is about 20 inches in length. It weighs about 6-10 pounds. Follow these instructions at home: Medicines  Take over-the-counter and prescription medicines only as told by your doctor. Some medicines are safe and  some medicines are not safe during pregnancy.  Take a prenatal vitamin that contains at least 600 micrograms (mcg) of folic acid.  If you have trouble pooping (constipation), take medicine that will make your stool soft (stool softener) if your doctor approves. Eating and drinking   Eat regular, healthy meals.  Avoid raw meat and uncooked cheese.  If you get low calcium from the food you eat, talk to your doctor about taking a daily calcium supplement.  Eat four or five small meals rather than three large meals a day.  Avoid foods that are high in fat and sugars, such as fried and sweet foods.  To prevent constipation: ? Eat foods that are high in fiber, like fresh fruits and vegetables, whole grains, and beans. ? Drink enough fluids to keep your pee (urine) clear or pale yellow. Activity  Exercise only as told by your doctor. Stop exercising if you start to have cramps.  Avoid heavy lifting, wear low heels, and sit up straight.  Do not exercise if it is too hot, too humid, or if you are in a place of great height (high altitude).  You may continue to have sex unless your doctor tells you not to. Relieving pain and discomfort  Wear a good support bra if your breasts are tender.  Take frequent breaks and rest with your legs raised if you have leg cramps or low back pain.  Take warm water baths (sitz baths) to soothe pain or discomfort caused by hemorrhoids. Use hemorrhoid cream if your doctor approves.  If you develop puffy, bulging veins (varicose veins) in your legs: ? Wear support hose or compression stockings as told by your doctor. ? Raise (elevate) your feet for 15 minutes, 3-4 times a day. ? Limit salt in your food. Safety  Wear your seat belt when driving.  Make a list of emergency phone numbers, including numbers for family, friends, the hospital, and police and fire departments. Preparing for your baby's arrival To prepare for the arrival of your baby:  Take  prenatal classes.  Practice driving to the hospital.  Visit the hospital and tour the maternity area.  Talk to your work about taking leave once the baby comes.  Pack your hospital bag.  Prepare the baby's room.  Go to your doctor visits.  Buy a rear-facing car seat. Learn how to install it in your car. General instructions  Do not use hot tubs, steam rooms, or saunas.  Do not use any products that contain nicotine or tobacco, such as cigarettes and e-cigarettes. If you need help quitting, ask your doctor.  Do not drink alcohol.  Do not douche or use tampons or scented sanitary pads.  Do not cross your legs for long periods of time.  Do not  travel for long distances unless you must. Only do so if your doctor says it is okay.  Visit your dentist if you have not gone during your pregnancy. Use a soft toothbrush to brush your teeth. Be gentle when you floss.  Avoid cat litter boxes and soil used by cats. These carry germs that can cause birth defects in the baby and can cause a loss of your baby (miscarriage) or stillbirth.  Keep all your prenatal visits as told by your doctor. This is important. Contact a doctor if:  You are not sure if you are in labor or if your water has broken.  You are dizzy.  You have mild cramps or pressure in your lower belly.  You have a nagging pain in your belly area.  You continue to feel sick to your stomach, you throw up, or you have watery poop.  You have bad smelling fluid coming from your vagina.  You have pain when you pee. Get help right away if:  You have a fever.  You are leaking fluid from your vagina.  You are spotting or bleeding from your vagina.  You have severe belly cramps or pain.  You lose or gain weight quickly.  You have trouble catching your breath and have chest pain.  You notice sudden or extreme puffiness (swelling) of your face, hands, ankles, feet, or legs.  You have not felt the baby move in over an  hour.  You have severe headaches that do not go away with medicine.  You have trouble seeing.  You are leaking, or you are having a gush of fluid, from your vagina before you are 37 weeks.  You have regular belly spasms (contractions) before you are 37 weeks. Summary  The third trimester is from week 28 through week 40 (months 7 through 9). This time is when your unborn baby is growing very fast.  Follow your doctor's advice about medicine, food, and activity.  Get ready for the arrival of your baby by taking prenatal classes, getting all the baby items ready, preparing the baby's room, and visiting your doctor to be checked.  Get help right away if you are bleeding from your vagina, or you have chest pain and trouble catching your breath, or if you have not felt your baby move in over an hour. This information is not intended to replace advice given to you by your health care provider. Make sure you discuss any questions you have with your health care provider. Document Released: 02/25/2010 Document Revised: 03/24/2019 Document Reviewed: 01/06/2017 Elsevier Patient Education  [place].   Evaluacin de los movimientos fetales Fetal Movement Counts Introduccin Nombre del paciente: ________________________________________________ Karalee Height estimada: ____________________ Allean Found evaluacin de los movimientos fetales?  Una evaluacin de los movimientos fetales es el registro del nmero de veces que siente que el beb se mueve durante un cierto perodo de [place]. Esto tambin se puede denominar recuento de patadas fetales. Una evaluacin de movimientos fetales se recomienda a todas las embarazadas. Es posible que le indiquen que comience a Chief of Staff los movimientos fetales desde la semana 28 de [place]. Preste atencin cuando sienta que el beb est ms activo. Podr detectar los ciclos en que el beb duerme y est despierto. Tambin podr detectar que ciertas cosas hacen  que su beb se mueva ms. Deber realizar una evaluacin de los movimientos fetales en las siguientes situaciones:  Cuando el beb est ms activo habitualmente.  A la misma hora, todos  los das. Un buen momento para evaluar los movimientos fetales es cuando est descansando, despus de haber comido y bebido algo. Cmo debo contar los movimientos fetales? 1. Encuentre un lugar tranquilo y cmodo. Sintese o acustese de lado. 2. Anote la fecha, la hora de inicio y de finalizacin y la cantidad de movimientos que sinti entre esas dos horas. Lleve esta informacin a las visitas de control. 3. Cuente las pataditas, revoloteos, chasquidos, vueltas o pinchazos en un perodo de 2horas. Debe sentir al menos 10movimientos en 2horas. 4. Cuando sienta 10movimientos, puede dejar de contar. 5. Si no siente 10movimientos en 2horas, coma y beba algo. Luego, contine descansando y Industrial/product designercontando durante 1hora. Si siente al menos 4movimientos durante esa hora, puede dejar de contar. Comunquese con un mdico si:  Siente menos de 4movimientos en 2horas.  El beb no se mueve tanto como suele hacerlo. Fecha: ____________ Stevan BornHora de inicio: ____________ Stevan BornHora de finalizacin: ____________ Movimientos: ____________ Franco NonesFecha: ____________ Stevan BornHora de inicio: ____________ Stevan BornHora de finalizacin: ____________ Movimientos: ____________ Franco NonesFecha: ____________ Stevan BornHora de inicio: ____________ Stevan BornHora de finalizacin: ____________ Movimientos: ____________ Franco NonesFecha: ____________ Stevan BornHora de inicio: ____________ Stevan BornHora de finalizacin: ____________ Movimientos: ____________ Franco NonesFecha: ____________ Stevan BornHora de inicio: ____________ Mammie RussianHora de finalizacin: ____________ Movimientos: ____________ Franco NonesFecha: ____________ Stevan BornHora de inicio: ____________ Mammie RussianHora de finalizacin: ____________ Movimientos: ____________ Franco NonesFecha: ____________ Stevan BornHora de inicio: ____________ Mammie RussianHora de finalizacin: ____________ Movimientos: ____________ Franco NonesFecha: ____________ Stevan BornHora de inicio: ____________  Stevan BornHora de finalizacin: ____________ Movimientos: ____________ Franco NonesFecha: ____________ Stevan BornHora de inicio: ____________ Mammie RussianHora de finalizacin: ____________ Movimientos: ____________ Esta informacin no tiene como fin reemplazar el consejo del mdico. Asegrese de hacerle al mdico cualquier pregunta que tenga. Document Released: 03/09/2008 Document Revised: 03/06/2017 Document Reviewed: 01/10/2016 Elsevier Patient Education  2020 [employer]

## 2019-09-04 NOTE — Progress Notes (Signed)
Patient ID: Krista Garcia, female   DOB: 03/12/1996, 23 y.o.   MRN: [id number]  Ganelle Berends is a 23 y.o. G1P0 at [redacted]w[redacted]d who is admitted management of fever and left flank pain for the last three (3) days, probable pyelonephritis. Estimated Date of Delivery: 09/18/19.  Fetal presentation is cephalic.  Length of Stay:  1 Days. Admitted 09/03/2019  Subjective:  Reports single episode of pain last night, but feeling "better overall". Concerned due to lack of bowel movement or passing gas in the last three (3) days. Notes irregular contractions that are not painful. Able to tolerate food and drink without nausea or vomiting.   Patient reports good fetal movement.  She reports no bleeding or no loss of fluid per vagina.  FOB at bedside for continuous support.   Review of Systems:  ROS negative except as noted above. Information obtained from patient and spouse with the use on an interpreter.   Objective:  Temp:  [97.7 F (36.5 C)-99.8 F (37.7 C)] 98.2 F (36.8 C) (09/20 1026) Pulse Rate:  [82-119] 98 (09/20 1026) Resp:  [14-18] 18 (09/20 1026) BP: (104-122)/(58-82) 114/69 (09/20 1026) SpO2:  [99 %-100 %] 100 % (09/20 1026)  Physical Examination:  CONSTITUTIONAL: Well-developed, well-nourished female in no acute distress.   SKIN: Skin is warm and dry. No rash noted. Not diaphoretic. No erythema. No pallor.  NEUROLGIC: Alert and oriented to person, place, and time. Normal reflexes, muscle tone coordination. No cranial nerve deficit noted.  PSYCHIATRIC: Normal mood and affect. Normal behavior. Normal judgment and thought content.  CARDIOVASCULAR: Normal heart rate noted, regular rhythm  RESPIRATORY: Effort and breath sounds normal, no problems with respiration noted  MUSCULOSKELETAL: Normal range of motion. No edema and no tenderness. 2+ distal pulses.  ABDOMEN: Soft, nontender, nondistended, gravid.  CERVIX: Deferred, no complaints.   Results for  orders placed or performed during the hospital encounter of 09/03/19 (from the past 48 hour(s))  Urinalysis, Routine w reflex microscopic     Status: Abnormal   Collection Time: 09/03/19  5:23 AM  Result Value Ref Range   Color, Urine YELLOW (A) YELLOW   APPearance CLOUDY (A) CLEAR   Specific Gravity, Urine 1.008 1.005 - 1.030   pH 7.0 5.0 - 8.0   Glucose, UA 50 (A) NEGATIVE mg/dL   Hgb urine dipstick MODERATE (A) NEGATIVE   Bilirubin Urine NEGATIVE NEGATIVE   Ketones, ur NEGATIVE NEGATIVE mg/dL   Protein, ur 30 (A) NEGATIVE mg/dL   Nitrite NEGATIVE NEGATIVE   Leukocytes,Ua LARGE (A) NEGATIVE   RBC / HPF 11-20 0 - 5 RBC/hpf   WBC, UA >50 (H) 0 - 5 WBC/hpf   Bacteria, UA RARE (A) NONE SEEN   Squamous Epithelial / LPF NONE SEEN 0 - 5   WBC Clumps PRESENT    Mucus PRESENT    Non Squamous Epithelial PRESENT (A) NONE SEEN    Comment: Performed at [facility], [address], [place], [place] [address]  Urine Culture     Status: Abnormal (Preliminary result)   Collection Time: 09/03/19  5:23 AM   Specimen: Urine, Random  Result Value Ref Range   Specimen Description      URINE, RANDOM Performed at [facility], [address], [place], [place] [address]    Special Requests      NONE Performed at [facility], [address], [place], [place] [address]    Culture 40,000 COLONIES/mL GRAM NEGATIVE RODS (A)  Report Status PENDING   CBC     Status: Abnormal   Collection Time: 09/03/19  9:53 AM  Result Value Ref Range   WBC 18.9 (H) 4.0 - 10.5 K/uL   RBC 3.96 3.87 - 5.11 MIL/uL   Hemoglobin 11.9 (L) 12.0 - 15.0 g/dL   HCT 35.0 (L) 36.0 - 46.0 %   MCV 88.4 80.0 - 100.0 fL   MCH 30.1 26.0 - 34.0 pg   MCHC 34.0 30.0 - 36.0 g/dL   RDW 13.9 11.5 - 15.5 %   Platelets 234 150 - 400 K/uL   nRBC 0.0 0.0 - 0.2 %    Comment: Performed at [facility], [address] [place], [place] [address]  Comprehensive metabolic panel     Status: Abnormal    Collection Time: 09/03/19  9:53 AM  Result Value Ref Range   Sodium 135 135 - 145 mmol/L   Potassium 3.6 3.5 - 5.1 mmol/L   Chloride 103 98 - 111 mmol/L   CO2 23 22 - 32 mmol/L   Glucose, Bld 118 (H) 70 - 99 mg/dL   BUN 8 6 - 20 mg/dL   Creatinine, Ser 0.59 0.44 - 1.00 mg/dL   Calcium 8.6 (L) 8.9 - 10.3 mg/dL   Total Protein 6.7 6.5 - 8.1 g/dL   Albumin 2.9 (L) 3.5 - 5.0 g/dL   AST 24 15 - 41 U/L   ALT 23 0 - 44 U/L   Alkaline Phosphatase 196 (H) 38 - 126 U/L   Total Bilirubin 0.9 0.3 - 1.2 mg/dL   GFR calc non Af Amer >60 >60 mL/min   GFR calc Af Amer >60 >60 mL/min   Anion gap 9 5 - 15    Comment: Performed at [facility], [address] [place], [place] [address]  SARS Coronavirus 2 [employer] order, Performed in [facility] hospital lab) Nasopharyngeal Nasopharyngeal Swab     Status: None   Collection Time: 09/03/19  9:53 AM   Specimen: Nasopharyngeal Swab  Result Value Ref Range   SARS Coronavirus 2 NEGATIVE NEGATIVE    Comment: (NOTE) If result is NEGATIVE SARS-CoV-2 target nucleic acids are NOT DETECTED. The SARS-CoV-2 RNA is generally detectable in upper and lower  respiratory specimens during the acute phase of infection. The lowest  concentration of SARS-CoV-2 viral copies this assay can detect is 250  copies / mL. A negative result does not preclude SARS-CoV-2 infection  and should not be used as the sole basis for treatment or other  patient management decisions.  A negative result may occur with  improper specimen collection / handling, submission of specimen other  than nasopharyngeal swab, presence of viral mutation(s) within the  areas targeted by this assay, and inadequate number of viral copies  (<250 copies / mL). A negative result must be combined with clinical  observations, patient history, and epidemiological information. If result is POSITIVE SARS-CoV-2 target nucleic acids are DETECTED. The SARS-CoV-2 RNA is generally detectable in upper  and lower  respiratory specimens dur ing the acute phase of infection.  Positive  results are indicative of active infection with SARS-CoV-2.  Clinical  correlation with patient history and other diagnostic information is  necessary to determine patient infection status.  Positive results do  not rule out bacterial infection or co-infection with other viruses. If result is PRESUMPTIVE POSTIVE SARS-CoV-2 nucleic acids MAY BE PRESENT.   A presumptive positive result was obtained on the submitted specimen  and confirmed on repeat  testing.  While 2019 novel coronavirus  (SARS-CoV-2) nucleic acids may be present in the submitted sample  additional confirmatory testing may be necessary for epidemiological  and / or clinical management purposes  to differentiate between  SARS-CoV-2 and other Sarbecovirus currently known to infect humans.  If clinically indicated additional testing with an alternate test  methodology [phone]([id number]) is advised. The SARS-CoV-2 RNA is generally  detectable in upper and lower respiratory sp ecimens during the acute  phase of infection. The expected result is Negative. Fact Sheet for Patients:  BoilerBrush.com.cyhttps://www.fda.gov/media/136312/download Fact Sheet for Healthcare Providers: https://pope.com/https://www.fda.gov/media/136313/download This test is not yet approved or cleared by the [place] FDA and has been authorized for detection and/or diagnosis of SARS-CoV-2 by FDA under an Emergency Use Authorization (EUA).  This EUA will remain in effect (meaning this test can be used) for the duration of the COVID-19 declaration under Section 564(b)(1) of the Act, 21 U.S.C. section 360bbb-3(b)(1), unless the authorization is terminated or revoked sooner. Performed at [facility], [address] [place], [place] [address]   Type and screen [facility]     Status: None   Collection Time: 09/04/19  5:51 AM  Result Value Ref Range   ABO/RH(D) O POS    Antibody  Screen NEG    Sample Expiration      09/07/2019,2359 Performed at [facility], [address] [place], [place] [address]   Comprehensive metabolic panel     Status: Abnormal   Collection Time: 09/04/19  5:51 AM  Result Value Ref Range   Sodium 136 135 - 145 mmol/L   Potassium 4.1 3.5 - 5.1 mmol/L   Chloride 104 98 - 111 mmol/L   CO2 23 22 - 32 mmol/L   Glucose, Bld 102 (H) 70 - 99 mg/dL   BUN <5 (L) 6 - 20 mg/dL   Creatinine, Ser 4.160.52 0.44 - 1.00 mg/dL   Calcium 8.6 (L) 8.9 - 10.3 mg/dL   Total Protein 6.5 6.5 - 8.1 g/dL   Albumin 2.8 (L) 3.5 - 5.0 g/dL   AST 20 15 - 41 U/L   ALT 20 0 - 44 U/L   Alkaline Phosphatase 208 (H) 38 - 126 U/L   Total Bilirubin 0.5 0.3 - 1.2 mg/dL   GFR calc non Af Amer >60 >60 mL/min   GFR calc Af Amer >60 >60 mL/min   Anion gap 9 5 - 15    Comment: Performed at [facility], [address] [place], [place] [address]  CBC     Status: Abnormal   Collection Time: 09/04/19  5:51 AM  Result Value Ref Range   WBC 15.0 (H) 4.0 - 10.5 K/uL   RBC 4.10 3.87 - 5.11 MIL/uL   Hemoglobin 12.1 12.0 - 15.0 g/dL   HCT 16.037.0 10.936.0 - 32.346.0 %   MCV 90.2 80.0 - 100.0 fL   MCH 29.5 26.0 - 34.0 pg   MCHC 32.7 30.0 - 36.0 g/dL   RDW 55.713.9 32.211.5 - 02.515.5 %   Platelets 214 150 - 400 K/uL   nRBC 0.0 0.0 - 0.2 %    Comment: Performed at [facility], [address] [place], [place] [address]    [place] Renal  Result Date: 09/03/2019 CLINICAL DATA:  Left flank pain for 2 days EXAM: RENAL / URINARY TRACT ULTRASOUND COMPLETE COMPARISON:  None. FINDINGS: Right Kidney: Renal measurements: 10.4 x 4.4 x 4.6 cm = volume: 112 mL . Echogenicity within normal limits. No  mass or hydronephrosis visualized. Left Kidney: Renal measurements: 12.4 x 6.1 x 6.7 cm = volume: 265 mL. Echogenicity within normal limits. No solid renal mass. Mild left hydronephrosis. Bladder: Appears normal for degree of bladder distention. IMPRESSION: 1. Normal right kidney. 2. Mild left  hydronephrosis which may be physiologic given the patient is [redacted] weeks pregnant. Electronically Signed   By: Elige KoHetal  Patel   On: 09/03/2019 16:23    Current scheduled medications . acetaminophen  1,000 mg Oral Q6H  . docusate sodium  100 mg Oral BID  . nitrofurantoin (macrocrystal-monohydrate)  100 mg Oral Q12H  . polyethylene glycol  17 g Oral Daily  . prenatal multivitamin  1 tablet Oral Q1200    I have reviewed the patient's current medications.  ASSESSMENT: Patient Active Problem List   Diagnosis Date Noted  . Indication for care in labor and delivery, antepartum 09/03/2019  . Pyelonephritis affecting pregnancy 09/03/2019  . Pregnancy 06/28/2019    PLAN:  CBC in AM.   Continue orders as written. Reassess as needed.   Anticipate discharge tomorrow.    Gunnar BullaJenkins Michelle , CNM [facility], [facility] 09/04/19 12:41 PM

## 2019-09-04 NOTE — Progress Notes (Signed)
Patient ID: Krista Garcia, female   DOB: 08-07-1996, 23 y.o.   MRN: [id number]  NONSTRESS TEST INTERPRETATION  INDICATIONS: Pyelonephritis  FHR baseline: 130 bpm RESULTS:Reactive COMMENTS: Irregular contractions, soft resting tone   PLAN:  Continue orders as written. Reassess as needed.   Anticipate discharge in morning.    Diona Fanti, CNM [facility], [facility] 09/04/19 1:45 PM

## 2019-09-05 DIAGNOSIS — O26893 Other specified pregnancy related conditions, third trimester: Secondary | ICD-10-CM | POA: Diagnosis not present

## 2019-09-05 DIAGNOSIS — Z20828 Contact with and (suspected) exposure to other viral communicable diseases: Secondary | ICD-10-CM | POA: Diagnosis present

## 2019-09-05 DIAGNOSIS — R509 Fever, unspecified: Secondary | ICD-10-CM | POA: Diagnosis present

## 2019-09-05 DIAGNOSIS — O2303 Infections of kidney in pregnancy, third trimester: Secondary | ICD-10-CM | POA: Diagnosis present

## 2019-09-05 DIAGNOSIS — Z3A37 37 weeks gestation of pregnancy: Secondary | ICD-10-CM | POA: Diagnosis not present

## 2019-09-05 DIAGNOSIS — R1032 Left lower quadrant pain: Secondary | ICD-10-CM | POA: Diagnosis not present

## 2019-09-05 LAB — CBC
HCT: 31.3 % — ABNORMAL LOW (ref 36.0–46.0)
Hemoglobin: 10.4 g/dL — ABNORMAL LOW (ref 12.0–15.0)
MCH: 30.1 pg (ref 26.0–34.0)
MCHC: 33.2 g/dL (ref 30.0–36.0)
MCV: 90.5 fL (ref 80.0–100.0)
Platelets: 192 10*3/uL (ref 150–400)
RBC: 3.46 MIL/uL — ABNORMAL LOW (ref 3.87–5.11)
RDW: 13.4 % (ref 11.5–15.5)
WBC: 11.3 10*3/uL — ABNORMAL HIGH (ref 4.0–10.5)
nRBC: 0 % (ref 0.0–0.2)

## 2019-09-05 LAB — URINE CULTURE: Culture: 40000 — AB

## 2019-09-05 MED ORDER — SODIUM CHLORIDE 0.9 % IV SOLN
INTRAVENOUS | Status: DC | PRN
  Administered 2019-09-05: 50 mL via INTRAVENOUS

## 2019-09-05 MED ORDER — NITROFURANTOIN MONOHYD MACRO 100 MG PO CAPS: 100.0000 mg | ORAL_CAPSULE | Freq: Two times a day (BID) | ORAL | 0 refills | Status: AC

## 2019-09-05 MED ORDER — DOCUSATE SODIUM 100 MG PO CAPS: 100.0000 mg | ORAL_CAPSULE | Freq: Two times a day (BID) | ORAL | 0 refills | Status: DC

## 2019-09-05 MED ORDER — NITROFURANTOIN MONOHYD MACRO 100 MG PO CAPS: 100.0000 mg | ORAL_CAPSULE | Freq: Every day | ORAL | 0 refills | Status: DC

## 2019-09-05 NOTE — Discharge Summary (Signed)
                              Discharge Summary  Date of Admission: 09/03/2019  Date of Discharge: 09/05/2019  Admitting Diagnosis: Flank pain, fever, and pyelonephritis  at [redacted]w[redacted]d  Discharge Diagnosis: No other diagnosis   Intrapartum Procedures: IV antibiotics  Complications: none                      Discharge Day SOAP Note:  Progress Note - Vaginal Delivery  Krista Garcia is a 23 y.o. G1P0 now day 3 , uncomplicated   Subjective  The patient has the following complaints: has no unusual complaints  Pain is controlled with current medications.   Patient is urinating without difficulty.  She is ambulating well and tolerating PO fluids and diet.    Objective  Vital signs: BP 113/83 (BP Location: Right Arm)   Pulse 89   Temp 97.8 F (36.6 C) (Oral)   Resp 16   SpO2 99%    NST: Krista Garcia Bran 1996/07/06 [redacted]w[redacted]d  Fetus A Non-Stress Test Interpretation for 09/05/19   Fetal Heart Rate A Mode: External Baseline Rate (A): 125 bpm Variability: Moderate Accelerations: 15 x 15 Decelerations: None  Uterine Activity Mode: Toco Contraction Frequency (min): Rare Contraction Duration (sec): 210 Contraction Quality: Mild Resting Tone Palpated: Relaxed Resting Time: Adequate   Physical Exam: Gen: NAD  Lungs clear bilaterally Abdomen: soft, non tender Legs : trace swelling            Data Review Labs: CBC Latest Ref Rng & Units 09/05/2019 09/04/2019 09/03/2019  WBC 4.0 - 10.5 K/uL 11.3(H) 15.0(H) 18.9(H)  Hemoglobin 12.0 - 15.0 g/dL 10.4(L) 12.1 11.9(L)  Hematocrit 36.0 - 46.0 % 31.3(L) 37.0 35.0(L)  Platelets 150 - 400 K/uL 192 214 234   O POS  Assessment/Plan  Active Problems:   Indication for care in labor and delivery, antepartum   Pyelonephritis affecting pregnancy    Plan for discharge today.   Discharge Instructions: Per After Visit Summary. Activity: Advance as tolerated. Pelvic rest for 6 weeks.  Also refer to After  Visit Summary Diet: Regular Medications: Allergies as of 09/05/2019   No Known Allergies     Medication List    TAKE these medications   docusate sodium 100 MG capsule Commonly known as: COLACE Take 1 capsule (100 mg total) by mouth 2 (two) times daily.   nitrofurantoin (macrocrystal-monohydrate) 100 MG capsule Commonly known as: MACROBID Take 1 capsule (100 mg total) by mouth every 12 (twelve) hours for 7 days.   nitrofurantoin (macrocrystal-monohydrate) 100 MG capsule Commonly known as: Macrobid Take 1 capsule (100 mg total) by mouth daily for 7 days. Start taking on: September 12, 2019   prenatal multivitamin Tabs tablet Take 1 tablet by mouth daily at 12 noon.      Outpatient follow up:  Follow-up Information    [facility] [place]. Go in 3 day(s).   Why: Keep appointment as previously scheduled for Wednesday Contact information: [place]  [place] [phone]         Follow up in office as scheduled on Wednesday   Discharged Condition: good  Discharged to: home    Philip Aspen, [place]  09/05/2019 11:21 AM

## 2019-09-05 NOTE — Progress Notes (Signed)
Interpreter requested for DC. Patient discharged home. Discharge instructions, prescriptions and follow up appointment given to and reviewed with patient. Patient verbalized understanding. Patient wheeled out by NT

## 2019-09-05 NOTE — Final Progress Note (Signed)
Discharge Day SOAP Note:  Progress Note - Vaginal Delivery  Krista Garcia is a 23 y.o. G1P0 now day 3 , uncomplicated   Subjective  The patient has the following complaints: has no unusual complaints  Pain is controlled with current medications.   Patient is urinating without difficulty.  She is ambulating well and tolerating PO fluids and diet.    Objective  Vital signs: BP 113/83 (BP Location: Right Arm)   Pulse 89   Temp 97.8 F (36.6 C) (Oral)   Resp 16   SpO2 99%    NST: Raeshawn Tafolla Bran Jul 25, 1996 [redacted]w[redacted]d  Fetus A Non-Stress Test Interpretation for 09/05/19   Fetal Heart Rate A Mode: External Baseline Rate (A): 125 bpm Variability: Moderate Accelerations: 15 x 15 Decelerations: None  Uterine Activity Mode: Toco Contraction Frequency (min): Rare Contraction Duration (sec): 210 Contraction Quality: Mild Resting Tone Palpated: Relaxed Resting Time: Adequate   Physical Exam: Gen: NAD  Lungs clear bilaterally Abdomen: soft, non tender Legs : trace swelling            Data Review Labs: CBC Latest Ref Rng & Units 09/05/2019 09/04/2019 09/03/2019  WBC 4.0 - 10.5 K/uL 11.3(H) 15.0(H) 18.9(H)  Hemoglobin 12.0 - 15.0 g/dL 10.4(L) 12.1 11.9(L)  Hematocrit 36.0 - 46.0 % 31.3(L) 37.0 35.0(L)  Platelets 150 - 400 K/uL 192 214 234   O POS  Assessment/Plan  Active Problems:   Indication for care in labor and delivery, antepartum   Pyelonephritis affecting pregnancy    Plan for discharge today.   Discharge Instructions: Per After Visit Summary. Activity: Advance as tolerated. Pelvic rest for 6 weeks.  Also refer to After Visit Summary Diet: Regular Medications: Allergies as of 09/05/2019   No Known Allergies     Medication List    TAKE these medications   docusate sodium 100 MG capsule Commonly known as: COLACE Take 1 capsule (100 mg total) by mouth 2 (two) times daily.   nitrofurantoin (macrocrystal-monohydrate) 100  MG capsule Commonly known as: MACROBID Take 1 capsule (100 mg total) by mouth every 12 (twelve) hours for 7 days.   nitrofurantoin (macrocrystal-monohydrate) 100 MG capsule Commonly known as: Macrobid Take 1 capsule (100 mg total) by mouth daily for 7 days. Start taking on: September 12, 2019   prenatal multivitamin Tabs tablet Take 1 tablet by mouth daily at 12 noon.      Outpatient follow up:  Follow-up Information    [facility] [place]. Go in 3 day(s).   Why: Keep appointment as previously scheduled for Wednesday Contact information: [place]  [place] [phone]         Follow up in office as scheduled on Wednesday   Discharged Condition: good  Discharged to: home    Philip Aspen, [place]  09/05/2019 11:21 AM

## 2019-09-05 NOTE — Progress Notes (Signed)
Called Mardene Celeste RN in [facility] and she states "It's not a good time to do the NST now"; "we will let you know when it is". Patient and her husband notified of same; they v/u of same.

## 2019-09-05 NOTE — Progress Notes (Signed)
To OBS 2 for NST

## 2019-09-05 NOTE — Progress Notes (Signed)
Pt back to 348 from NST

## 2019-09-07 ENCOUNTER — Ambulatory Visit (INDEPENDENT_AMBULATORY_CARE_PROVIDER_SITE_OTHER): Payer: Medicaid Other | Admitting: Obstetrics and Gynecology

## 2019-09-07 ENCOUNTER — Other Ambulatory Visit: Payer: Self-pay

## 2019-09-07 VITALS — BP 107/75 | HR 94 | Wt 162.9 lb

## 2019-09-07 DIAGNOSIS — Z3493 Encounter for supervision of normal pregnancy, unspecified, third trimester: Secondary | ICD-10-CM

## 2019-09-07 LAB — POCT URINALYSIS DIPSTICK OB
Bilirubin, UA: NEGATIVE
Blood, UA: NEGATIVE
Glucose, UA: NEGATIVE
Ketones, UA: NEGATIVE
Leukocytes, UA: NEGATIVE
Nitrite, UA: NEGATIVE
POC,PROTEIN,UA: NEGATIVE
Spec Grav, UA: 1.01 (ref 1.010–1.025)
Urobilinogen, UA: 0.2 E.U./dL
pH, UA: 7 (ref 5.0–8.0)

## 2019-09-07 NOTE — Progress Notes (Signed)
ROB- pt is having some pelvic pressure 

## 2019-09-07 NOTE — Progress Notes (Signed)
ROB- doing well, does reports increased pelvic pressure. Discussed antibiotic use.

## 2019-09-09 ENCOUNTER — Encounter: Payer: Medicaid Other | Admitting: Certified Nurse Midwife

## 2019-09-15 ENCOUNTER — Encounter: Payer: Self-pay | Admitting: Certified Nurse Midwife

## 2019-09-15 ENCOUNTER — Other Ambulatory Visit: Payer: Self-pay

## 2019-09-15 ENCOUNTER — Ambulatory Visit (INDEPENDENT_AMBULATORY_CARE_PROVIDER_SITE_OTHER): Payer: Medicaid Other | Admitting: Certified Nurse Midwife

## 2019-09-15 VITALS — BP 114/72 | HR 72 | Wt 165.0 lb

## 2019-09-15 DIAGNOSIS — O480 Post-term pregnancy: Secondary | ICD-10-CM

## 2019-09-15 DIAGNOSIS — Z3493 Encounter for supervision of normal pregnancy, unspecified, third trimester: Secondary | ICD-10-CM

## 2019-09-15 LAB — POCT URINALYSIS DIPSTICK OB
Bilirubin, UA: NEGATIVE
Blood, UA: NEGATIVE
Glucose, UA: NEGATIVE
Ketones, UA: NEGATIVE
Leukocytes, UA: NEGATIVE
Nitrite, UA: NEGATIVE
POC,PROTEIN,UA: NEGATIVE
Spec Grav, UA: <=1.005 — AB (ref 1.010–1.025)
Urobilinogen, UA: 0.2 E.U./dL
pH, UA: 7 (ref 5.0–8.0)

## 2019-09-15 MED ORDER — NITROFURANTOIN MONOHYD MACRO 100 MG PO CAPS: 100.0000 mg | ORAL_CAPSULE | Freq: Every day | ORAL | 1 refills | Status: DC

## 2019-09-15 NOTE — Patient Instructions (Addendum)
Nitrofurantoin tablets or capsules O que  este medicamento? A NITROFURANTONA  um antibitico.  usada para tratar as infeces do trato urinrio. Este medicamento pode ser usado para outros propsitos; em caso de dvidas, pergunte ao seu profissional de sade ou farmacutico. NOMES DE MARCAS COMUNS: Macrobid, Macrodantin, Urotoin O que devo dizer a meu profissional de sade antes de tomar este medicamento? Precisam saber se voc tem algum dos seguintes problemas ou estados de sade:  anemia  diabetes  deficincia de glicose-6-fosfato desidrogenase (G6PD)  doenas renais  doenas hepticas  doenas pulmonares  outras doenas crnicas  reao estranha ou alergia  nitrofurantona  reao estranha ou alergia a outros antibiticos ou medicamentos  reao estranha ou alergia a alimentos, corantes ou conservantes  est grvida ou tentando engravidar  est amamentando Como devo usar este medicamento? Tome este medicamento por via oral com um copo d'gua. Siga as instrues na embalagem ou na bula. [employer] medicamento com comida ou leite. Tome este medicamento em intervalos regulares. No tome este medicamento com frequncia maior do que a indicada. No pare de usar [employer] a menos que seu mdico [place]. Fale com seu pediatra a respeito do uso deste medicamento em crianas. Embora este medicamento possa ser receitado para certas condies, algumas precaues so necessrias. Superdosagem: Se achar que tomou uma superdosagem deste medicamento, entre em contato imediatamente com o [facility] ou v a [facility]. OBSERVAO: Este medicamento  s para voc. No compartilhe este medicamento com outras pessoas. E se eu deixar de tomar uma dose? Se perder uma dose, tome-a assim que possvel. Se j estiver quase na hora da sua prxima dose, tome somente essa dose. No tome o remdio em dobro, nem tome uma dose adicional. O que pode interagir com  este medicamento?  anticidos que contenham trissilicato de magnsio  probenecida  antibiticos tipo quinolona, como ciprofloxacino, lomefloxacino, norfloxacino e ofloxacino  sulfinpirazona Esta lista pode no descrever todas as interaes possveis. D ao seu profissional de sade uma lista de todos os medicamentos, ervas medicinais, remdios de venda livre, ou suplementos alimentares que voc [place]. Diga tambm se voc fuma, bebe, ou [place] drogas ilcitas. Alguns destes podem interagir com o seu medicamento. Ao que devo ficar atento quando estiver [employer] medicamento? Avise seu mdico ou profissional de sade se os seus sintomas no melhorarem, se piorarem ou se surgirem novos sintomas. Beba vrios copos de gua por dia. Se estiver tomando este medicamento por um perodo prolongado, consulte seu mdico ou profissional de sade para acompanhamento regular da sua evoluo. Se tem diabetes, pode ocorrer um resultado falso positivo no exame de glicose na urina com certas marcas de testes de urina. Entre em contato com seu mdico ou profissional de sade. Que efeitos colaterais posso sentir aps usar este medicamento? Efeitos colaterais que devem ser informados ao seu mdico ou profissional de sade o mais rpido possvel:  reaes alrgicas, como erupo na pele, coceira, urticria, ou inchao do rosto, dos lbios ou da lngua  dor no peito  tosse  dificuldade para respirar  sonolncia, tontura  febre, infeco  dores nas articulaes  palidez ou pele azulada  vermelhido, bolhas, descamao ou afrouxamento da pele, inclusive dentro da boca  formigamento, queimao, dor ou dormncia nas mos ou ps  sangramentos ou hematomas fora do comum  fraqueza ou cansao fora do comum  olhos ou pele amarelados Efeitos colaterais que normalmente no precisam de cuidados mdicos (avise ao seu mdico ou profissional de sade se  persistirem ou forem incmodos):  urina escura  diarreia   dor de cabea  perda de apetite  enjoo ou vmitos  queda de cabelo temporria Esta lista pode no descrever todos os efeitos colaterais possveis. Para mais orientaes sobre efeitos colaterais, consulte o seu mdico. Voc pode relatar a ocorrncia de efeitos colaterais  FDA pelo telefone [phone]. Onde devo guardar meu medicamento? [facility] fora do [employer]. Conservar em temperatura ambiente, entre 15 e 30 degreesC [phone] e 86 degreesF). Proteger Administrator, arts. Descartar qualquer medicamento no utilizado aps a data de validade impressa no rtulo ou embalagem. OBSERVAO: Este folheto  um resumo. Pode no cobrir todas as informaes possveis. Se tiver dvidas a respeito deste medicamento, fale com seu mdico, farmacutico ou profissional de sade.  2020 Elsevier/Gold Standard (2010-09-04 00:00:00) Evaluacin de los movimientos fetales Fetal Movement Counts Introduccin Nombre del paciente: ________________________________________________ Krista Garcia estimada: ____________________ Allean Found evaluacin de los movimientos fetales?  Una evaluacin de los movimientos fetales es el registro del nmero de veces que siente que el beb se mueve durante un cierto perodo de [place]. Esto tambin se puede denominar recuento de patadas fetales. Una evaluacin de movimientos fetales se recomienda a todas las embarazadas. Es posible que le indiquen que comience a Chief of Staff los movimientos fetales desde la semana 28 de [place]. Preste atencin cuando sienta que el beb est ms activo. Podr detectar los ciclos en que el beb duerme y est despierto. Tambin podr detectar que ciertas cosas hacen que su beb se mueva ms. Deber realizar una evaluacin de los movimientos fetales en las siguientes situaciones:  Cuando el beb est ms activo habitualmente.  A la [employer], todos los [place]. Un buen momento para evaluar los movimientos fetales es cuando est descansando, despus de haber comido  y bebido algo. Cmo debo contar los movimientos fetales? 1. Encuentre un lugar tranquilo y cmodo. Sintese o acustese de lado. 2. Anote la fecha, la hora de inicio y de finalizacin y la cantidad de movimientos que sinti entre esas dos horas. Lleve esta informacin a las visitas de control. 3. Cuente las pataditas, revoloteos, chasquidos, vueltas o pinchazos en un perodo de 2horas. Debe sentir al menos 84movimientos en 2horas. 4. Cuando sienta 84movimientos, puede dejar de contar. 5. Si no siente 59movimientos en 2horas, coma y beba algo. Luego, contine descansando y Financial risk analyst. Si siente al menos 32movimientos durante esa hora, puede dejar de contar. Comunquese con un mdico si:  Siente menos de 67movimientos en 2horas.  El beb no se mueve tanto como suele hacerlo. Fecha: ____________ Eda Paschal inicio: ____________ Eda Paschal finalizacin: ____________ Movimientos: ____________ Toma Copier: ____________ Eda Paschal inicio: ____________ Eda Paschal finalizacin: ____________ Movimientos: ____________ Toma Copier: ____________ Eda Paschal inicio: ____________ Eda Paschal finalizacin: ____________ Movimientos: ____________ Toma Copier: ____________ Eda Paschal inicio: ____________ Eda Paschal finalizacin: ____________ Movimientos: ____________ Toma Copier: ____________ Eda Paschal inicio: ____________ Mellody Drown de finalizacin: ____________ Movimientos: ____________ Toma Copier: ____________ Eda Paschal inicio: ____________ Mellody Drown de finalizacin: ____________ Movimientos: ____________ Toma Copier: ____________ Eda Paschal inicio: ____________ Mellody Drown de finalizacin: ____________ Movimientos: ____________ Toma Copier: ____________ Eda Paschal inicio: ____________ Eda Paschal finalizacin: ____________ Movimientos: ____________ Toma Copier: ____________ Eda Paschal inicio: ____________ Mellody Drown de finalizacin: ____________ Movimientos: ____________ Esta informacin no tiene como fin reemplazar el consejo del mdico. Asegrese de hacerle al mdico cualquier pregunta que tenga.  Document Released: 03/09/2008 Document Revised: 03/06/2017 Document Reviewed: 01/10/2016 Elsevier Patient Education  2020 [employer].

## 2019-09-15 NOTE — Progress Notes (Addendum)
ROB-Doing well, reports clear vaginal discharge since this morning. Request SVE, unchanged since last visit; bag felt, nitrazine negative, fern negative, no pooling. Rx Macrobid, see orders; suppression due to history of pyelo. Education regarding postdates care. Reviewed red flag symptoms and when to call. RTC x 1 week for growth ultrasound/BPP and ROB or sooner if needed.

## 2019-09-15 NOTE — Progress Notes (Signed)
ROB-Pt is present for routine prenatal care. Used interpreter #[id number].  Pt stated that she was doing well.

## 2019-09-16 ENCOUNTER — Inpatient Hospital Stay
Admission: EM | Admit: 2019-09-16 | Discharge: 2019-09-19 | DRG: 807 | Disposition: A | Discharge status: Home / Self Care | Payer: Medicaid Other | Attending: Certified Nurse Midwife | Admitting: Certified Nurse Midwife

## 2019-09-16 ENCOUNTER — Encounter: Payer: Self-pay | Admitting: *Deleted

## 2019-09-16 DIAGNOSIS — Z3A39 39 weeks gestation of pregnancy: Secondary | ICD-10-CM

## 2019-09-16 DIAGNOSIS — Z87440 Personal history of urinary (tract) infections: Secondary | ICD-10-CM

## 2019-09-16 DIAGNOSIS — Z20828 Contact with and (suspected) exposure to other viral communicable diseases: Secondary | ICD-10-CM | POA: Diagnosis present

## 2019-09-17 DIAGNOSIS — Z3A39 39 weeks gestation of pregnancy: Secondary | ICD-10-CM

## 2019-09-17 DIAGNOSIS — Z20828 Contact with and (suspected) exposure to other viral communicable diseases: Secondary | ICD-10-CM | POA: Diagnosis present

## 2019-09-17 DIAGNOSIS — Z87440 Personal history of urinary (tract) infections: Secondary | ICD-10-CM | POA: Diagnosis not present

## 2019-09-17 DIAGNOSIS — O26893 Other specified pregnancy related conditions, third trimester: Secondary | ICD-10-CM | POA: Diagnosis present

## 2019-09-17 LAB — TYPE AND SCREEN
ABO/RH(D): O POS
Antibody Screen: NEGATIVE

## 2019-09-17 LAB — RAPID HIV SCREEN (HIV 1/2 AB+AG)
HIV 1/2 Antibodies: NONREACTIVE
HIV 1/2 Antibodies: NONREACTIVE
HIV-1 P24 Antigen - HIV24: NONREACTIVE
HIV-1 P24 Antigen - HIV24: NONREACTIVE

## 2019-09-17 LAB — CBC
HCT: 34.6 % — ABNORMAL LOW (ref 36.0–46.0)
Hemoglobin: 11.7 g/dL — ABNORMAL LOW (ref 12.0–15.0)
MCH: 29.7 pg (ref 26.0–34.0)
MCHC: 33.8 g/dL (ref 30.0–36.0)
MCV: 87.8 fL (ref 80.0–100.0)
Platelets: 267 10*3/uL (ref 150–400)
RBC: 3.94 MIL/uL (ref 3.87–5.11)
RDW: 13.6 % (ref 11.5–15.5)
WBC: 13.7 10*3/uL — ABNORMAL HIGH (ref 4.0–10.5)
nRBC: 0 % (ref 0.0–0.2)

## 2019-09-17 LAB — SARS CORONAVIRUS 2 BY RT PCR (HOSPITAL ORDER, PERFORMED IN ~~LOC~~ HOSPITAL LAB): SARS Coronavirus 2: NEGATIVE

## 2019-09-17 LAB — RPR: RPR Ser Ql: NONREACTIVE

## 2019-09-17 MED ORDER — LIDOCAINE HCL (PF) 1 % IJ SOLN
30.0000 mL | INTRAMUSCULAR | Status: AC | PRN
  Administered 2019-09-17: 08:00:00 30 mL via SUBCUTANEOUS

## 2019-09-17 MED ORDER — LIDOCAINE HCL (PF) 1 % IJ SOLN
INTRAMUSCULAR | Status: AC
  Administered 2019-09-17: 08:00:00 30 mL via SUBCUTANEOUS
  Filled 2019-09-17: qty 30

## 2019-09-17 MED ORDER — LACTATED RINGERS IV SOLN: 500.0000 mL | INTRAVENOUS | Status: DC | PRN

## 2019-09-17 MED ORDER — MISOPROSTOL 200 MCG PO TABS
ORAL_TABLET | ORAL | Status: AC
  Administered 2019-09-17: 08:00:00
  Filled 2019-09-17: qty 4

## 2019-09-17 MED ORDER — AMMONIA AROMATIC IN INHA
RESPIRATORY_TRACT | Status: AC
  Filled 2019-09-17: qty 10

## 2019-09-17 MED ORDER — WITCH HAZEL-GLYCERIN EX PADS: 1.0000 "application " | MEDICATED_PAD | CUTANEOUS | Status: DC | PRN

## 2019-09-17 MED ORDER — ONDANSETRON HCL 4 MG/2ML IJ SOLN: 4.0000 mg | INTRAMUSCULAR | Status: DC | PRN

## 2019-09-17 MED ORDER — FERROUS SULFATE 325 (65 FE) MG PO TABS
325.0000 mg | ORAL_TABLET | Freq: Two times a day (BID) | ORAL | Status: DC
  Administered 2019-09-17 – 2019-09-19 (×5): 325 mg via ORAL
  Filled 2019-09-17 (×5): qty 1

## 2019-09-17 MED ORDER — ACETAMINOPHEN 325 MG PO TABS
650.0000 mg | ORAL_TABLET | ORAL | Status: DC | PRN
  Administered 2019-09-17 – 2019-09-19 (×8): 650 mg via ORAL
  Filled 2019-09-17 (×8): qty 2

## 2019-09-17 MED ORDER — ACETAMINOPHEN 325 MG PO TABS: 650.0000 mg | ORAL_TABLET | ORAL | Status: DC | PRN

## 2019-09-17 MED ORDER — FENTANYL CITRATE (PF) 100 MCG/2ML IJ SOLN
50.0000 ug | INTRAMUSCULAR | Status: DC | PRN
  Administered 2019-09-17: 50 ug via INTRAVENOUS
  Filled 2019-09-17: qty 2

## 2019-09-17 MED ORDER — COCONUT OIL OIL
1.0000 "application " | TOPICAL_OIL | Status: DC | PRN
  Filled 2019-09-17: qty 120

## 2019-09-17 MED ORDER — BENZOCAINE-MENTHOL 20-0.5 % EX AERO: 1.0000 "application " | INHALATION_SPRAY | CUTANEOUS | Status: DC | PRN

## 2019-09-17 MED ORDER — SIMETHICONE 80 MG PO CHEW: 80.0000 mg | CHEWABLE_TABLET | ORAL | Status: DC | PRN

## 2019-09-17 MED ORDER — PRENATAL MULTIVITAMIN CH
1.0000 | ORAL_TABLET | Freq: Every day | ORAL | Status: DC
  Administered 2019-09-17 – 2019-09-19 (×3): 1 via ORAL
  Filled 2019-09-17 (×3): qty 1

## 2019-09-17 MED ORDER — SODIUM CHLORIDE 0.9% FLUSH: 3.0000 mL | INTRAVENOUS | Status: DC | PRN

## 2019-09-17 MED ORDER — IBUPROFEN 600 MG PO TABS
600.0000 mg | ORAL_TABLET | Freq: Four times a day (QID) | ORAL | Status: DC
  Administered 2019-09-17 – 2019-09-19 (×10): 600 mg via ORAL
  Filled 2019-09-17 (×10): qty 1

## 2019-09-17 MED ORDER — SODIUM CHLORIDE 0.9% FLUSH
3.0000 mL | Freq: Two times a day (BID) | INTRAVENOUS | Status: DC
  Administered 2019-09-17 – 2019-09-18 (×3): 3 mL via INTRAVENOUS

## 2019-09-17 MED ORDER — TETANUS-DIPHTH-ACELL PERTUSSIS 5-2.5-18.5 LF-MCG/0.5 IM SUSP
0.5000 mL | Freq: Once | INTRAMUSCULAR | Status: DC
  Filled 2019-09-17: qty 0.5

## 2019-09-17 MED ORDER — DIBUCAINE (PERIANAL) 1 % EX OINT: 1.0000 "application " | TOPICAL_OINTMENT | CUTANEOUS | Status: DC | PRN

## 2019-09-17 MED ORDER — ONDANSETRON HCL 4 MG/2ML IJ SOLN: 4.0000 mg | Freq: Four times a day (QID) | INTRAMUSCULAR | Status: DC | PRN

## 2019-09-17 MED ORDER — OXYTOCIN 10 UNIT/ML IJ SOLN
INTRAMUSCULAR | Status: AC
  Filled 2019-09-17: qty 2

## 2019-09-17 MED ORDER — LACTATED RINGERS IV SOLN: INTRAVENOUS | Status: DC

## 2019-09-17 MED ORDER — OXYTOCIN BOLUS FROM INFUSION
500.0000 mL | Freq: Once | INTRAVENOUS | Status: AC
  Administered 2019-09-17: 08:00:00 500 mL via INTRAVENOUS

## 2019-09-17 MED ORDER — SOD CITRATE-CITRIC ACID 500-334 MG/5ML PO SOLN: 30.0000 mL | ORAL | Status: DC | PRN

## 2019-09-17 MED ORDER — OXYTOCIN 40 UNITS IN NORMAL SALINE INFUSION - SIMPLE MED
2.5000 [IU]/h | INTRAVENOUS | Status: DC
  Filled 2019-09-17: qty 1000

## 2019-09-17 MED ORDER — ONDANSETRON HCL 4 MG PO TABS: 4.0000 mg | ORAL_TABLET | ORAL | Status: DC | PRN

## 2019-09-17 MED ORDER — DIPHENHYDRAMINE HCL 25 MG PO CAPS: 25.0000 mg | ORAL_CAPSULE | Freq: Four times a day (QID) | ORAL | Status: DC | PRN

## 2019-09-17 MED ORDER — DOCUSATE SODIUM 100 MG PO CAPS
100.0000 mg | ORAL_CAPSULE | Freq: Two times a day (BID) | ORAL | Status: DC
  Administered 2019-09-17 – 2019-09-19 (×4): 100 mg via ORAL
  Filled 2019-09-17 (×4): qty 1

## 2019-09-17 MED ORDER — SODIUM CHLORIDE 0.9 % IV SOLN: 250.0000 mL | INTRAVENOUS | Status: DC | PRN

## 2019-09-17 MED ORDER — ZOLPIDEM TARTRATE 5 MG PO TABS: 5.0000 mg | ORAL_TABLET | Freq: Every evening | ORAL | Status: DC | PRN

## 2019-09-17 NOTE — Lactation Note (Signed)
This note was copied from a baby's chart. Lactation Consultation Note  Patient Name: Krista Garcia [id number] Date: 09/17/2019 Reason for consult: Follow-up assessment;Primapara;1st time breastfeeding;Term  P1 mother whose infant is now 28 hours old.    Pediatrician requested latch assistance.  Spanish interpreter [phone]) used for interpretation.  In house interpreter was busy with another patient.  Mother had baby latched to the right breast when I arrived.  The baby was not positioned correctly and had a shallow latch.  Mother complained of nipple pain.  With her permission, released baby from the breast and assisted mother to get into a comfortable position.  Assisted baby to latch in the football position on the right breast.  Baby latched easily and had a wide gape and flanged lips.  She began rhythmically sucking and a few audible swallows noted.  Spent much time educating mother on breast feeding basics while baby continued to feed.  Demonstrated breast compressions and had to remind mother numerous times about hand/finger positioning.  Reinforced deep latching and explained that baby was able to breathe.  Mother is somewhat nervous about feeding and will need reinforcement on basic concepts.  Observed baby feeding for 10 minutes before self releasing.  Mother's nipple rounded and intact.  Placed baby STS on mother's chest and she fell asleep.  Mother will continue to observe for feeding cues and ask for latch assistance as needed.  Father present.  RN updated.   Maternal Data Formula Feeding for Exclusion: No Has patient been taught Hand Expression?: Yes Does the patient have breastfeeding experience prior to this delivery?: No  Feeding Feeding Type: Breast Fed  LATCH Score Latch: Grasps breast easily, tongue down, lips flanged, rhythmical sucking.  Audible Swallowing: A few with stimulation  Type of Nipple: Everted at rest and after stimulation  Comfort  (Breast/Nipple): Soft / non-tender  Hold (Positioning): Assistance needed to correctly position infant at breast and maintain latch.  LATCH Score: 8  Interventions Interventions: Breast feeding basics reviewed;Assisted with latch;Skin to skin;Breast massage;Breast compression;Position options;Support pillows;Adjust position  Lactation Tools Discussed/Used WIC Program: No   Consult Status Consult Status: Follow-up Date: 09/18/19 Follow-up type: In-patient    Tenessa Marsee R Kortney Schoenfelder 09/17/2019, 3:20 PM

## 2019-09-17 NOTE — Progress Notes (Signed)
Krista Garcia is a 23 y.o. G1P0 at [redacted]w[redacted]d by LMP admitted for active labor  Subjective: Crying through contraction, reports urge to push  Objective: BP (!) 142/78   Pulse 79   Temp 98.3 F (36.8 C) (Oral)   Resp 20  No intake/output data recorded. No intake/output data recorded.  FHT:  FHR: 144 bpm, variability: moderate,  accelerations:  Present,  decelerations:  Absent UC:   regular, every 2-3 minutes SVE:   Dilation: Lip/rim Effacement (%): 90 Station: 0 Exam by:: A Norma Fredrickson RN   Labs: Lab Results  Component Value Date   WBC 13.7 (H) 09/17/2019   HGB 11.7 (L) 09/17/2019   HCT 34.6 (L) 09/17/2019   MCV 87.8 09/17/2019   PLT 267 09/17/2019    Assessment / Plan: Spontaneous labor, progressing normally  Labor: Progressing normally Preeclampsia:  labs stable Fetal Wellbeing:  Category I Pain Control:  IV pain meds I/D:  n/a Anticipated MOD:  NSVD  Melody N Shambley 09/17/2019, 6:15 AM

## 2019-09-17 NOTE — Progress Notes (Signed)
Krista Garcia is a 23 y.o. G1P0 at [redacted]w[redacted]d by LMP admitted for active labor  Subjective: Writhing in bed with pain, declines pain meds or epidural  Objective: BP (!) 142/78   Pulse 79   Temp 98.3 F (36.8 C) (Oral)   Resp 20  No intake/output data recorded. No intake/output data recorded.  FHT:  FHR: 140 bpm, variability: moderate,  accelerations:  Present,  decelerations:  Absent UC:   irregular, every 3-6 minutes, moderate to palpation SVE:   Dilation: 7.5 Effacement (%): 90 Station: -1 Exam by:: Felisa Bonier, CNM ,AROM with moderate amount MSAF  Labs: Lab Results  Component Value Date   WBC 13.7 (H) 09/17/2019   HGB 11.7 (L) 09/17/2019   HCT 34.6 (L) 09/17/2019   MCV 87.8 09/17/2019   PLT 267 09/17/2019    Assessment / Plan: Spontaneous labor, progressing normally, counseled patient and FOB about pain management options and MSAF  Labor: Progressing normally Preeclampsia:  labs stable Fetal Wellbeing:  Category I Pain Control:  Labor support without medications I/D:  n/a Anticipated MOD:  NSVD  Abhijay Morriss N Leetta Hendriks 09/17/2019, 3:20 AM

## 2019-09-17 NOTE — Lactation Note (Signed)
This note was copied from a baby's chart. Lactation Consultation Note  Patient Name: [name] [id number] Date: 09/17/2019 Reason for consult: Initial assessment;Term;Primapara;1st time breastfeeding  P1 mother whose infant is now 41 hours old.    In house Spanish interpreter used for interpretation.  Baby was asleep in the bassinet when I arrived.  Mother was very sleepy. Encouraged to feed 8-12 times/24 hours or sooner if baby shows feeding cues.  Reviewed cues with parents.  Taught mother hand expression and she was able to express colostrum drops.  Encouraged her to feed back any drops she obtains to baby.  Finger feeding demonstrated.  I rubbed the colostrum drops in baby's mouth but she did not awaken.  Mother's breasts are soft and non tender and nipples are everted and intact.  Mother will perform hand expression before/after latching to stimulate breasts and milk supply.  Suggested she call for latch assistance with the next feeding.  Mother did not take any virtual breast feeding classes during her pregnancy.    Mother does not have a DEBP for home use.  She is not a [facility] participant nor does she have private insurance.  She will be a "stay at home" mother after discharge.  Encouraged her to call her RN/LC for any questions/concerns she may have.  Father present.    Maternal Data Formula Feeding for Exclusion: No Has patient been taught Hand Expression?: Yes Does the patient have breastfeeding experience prior to this delivery?: No  Feeding    LATCH Score Latch: Repeated attempts needed to sustain latch, nipple held in mouth throughout feeding, stimulation needed to elicit sucking reflex.  Audible Swallowing: A few with stimulation  Type of Nipple: Flat(nipple shield given #20)  Comfort (Breast/Nipple): Soft / non-tender  Hold (Positioning): Assistance needed to correctly position infant at breast and maintain latch.  LATCH Score: 6  Interventions     Lactation Tools Discussed/Used WIC Program: No   Consult Status Consult Status: Follow-up Date: 09/18/19 Follow-up type: In-patient    Hensley Treat R Shay Bartoli 09/17/2019, 12:19 PM

## 2019-09-17 NOTE — H&P (Signed)
Obstetric History and Physical  Krista Garcia is a 23 y.o. G1P0 with IUP at [redacted]w[redacted]d presenting with irregular but painful contractions, and now active labor. Patient states she has been having  irregular, every 2-4 minutes contractions, minimal vaginal bleeding, intact membranes, with active fetal movement.    Prenatal Course [facility] Care: [facility]  Pregnancy complications or risks:pyelo in 3rd trimester  Prenatal labs and studies: ABO, Rh: --/--/O POS (09/20 9371) Antibody: NEG (09/20 0551) Rubella:   RPR:    HBsAg:    HIV:    GBS:--/Negative (09/09 1521) 1 hr Glucola  Elevated, with normal 3 HrGTT Genetic screening not done Anatomy US normal  Past Medical History:  Diagnosis Date  . Heart murmur   . Heart murmur   . Medical history non-contributory     Past Surgical History:  Procedure Laterality Date  . BREAST CYST EXCISION Left   . BREAST SURGERY      OB History  Gravida Para Term Preterm AB Living  1            SAB TAB Ectopic Multiple Live Births               # Outcome Date GA Lbr Len/2nd Weight Sex Delivery Anes PTL Lv  1 Current             Social History   Socioeconomic History  . Marital status: Single    Spouse name: Not on file  . Number of children: Not on file  . Years of education: Not on file  . Highest education level: Not on file  Occupational History  . Not on file  Social Needs  . Financial resource strain: Not on file  . Food insecurity    Worry: Not on file    Inability: Not on file  . Transportation needs    Medical: Not on file    Non-medical: Not on file  Tobacco Use  . Smoking status: Never Smoker  . Smokeless tobacco: Never Used  Substance and Sexual Activity  . Alcohol use: Not Currently  . Drug use: Never  . Sexual activity: Yes    Birth control/protection: None  Lifestyle  . Physical activity    Days per week: Not on file    Minutes per session: Not on file  . Stress: Not on file  Relationships  . Social  Musician on phone: Not on file    Gets together: Not on file    Attends religious service: Not on file    Active member of club or organization: Not on file    Attends meetings of clubs or organizations: Not on file    Relationship status: Not on file  Other Topics Concern  . Not on file  Social History Narrative  . Not on file    Family History  Problem Relation Age of Onset  . Diabetes Father   . Diabetes Paternal Grandmother   . Stroke Paternal Grandmother   . Diabetes Paternal Grandfather     Medications Prior to Admission  Medication Sig Dispense Refill Last Dose  . nitrofurantoin, macrocrystal-monohydrate, (MACROBID) 100 MG capsule Take 1 capsule (100 mg total) by mouth at bedtime. 30 capsule 1 09/16/2019 at Unknown time  . Prenatal Vit-Fe Fumarate-FA (PRENATAL MULTIVITAMIN) TABS tablet Take 1 tablet by mouth daily at 12 noon.   09/16/2019 at Unknown time  . docusate sodium (COLACE) 100 MG capsule Take 1 capsule (100 mg total) by mouth 2 (  two) times daily. (Patient not taking: Reported on 09/07/2019) 10 capsule 0 Not Taking at Unknown time  . nitrofurantoin, macrocrystal-monohydrate, (MACROBID) 100 MG capsule Take 1 capsule (100 mg total) by mouth daily for 7 days. (Patient not taking: Reported on 09/07/2019) 30 capsule 0 Not Taking at Unknown time    No Known Allergies  Review of Systems: Negative except for what is mentioned in HPI.  Physical Exam: There were no vitals taken for this visit. GENERAL: Well-developed, well-nourished female in no acute distress.  LUNGS: Clear to auscultation bilaterally.  HEART: Regular rate and rhythm. ABDOMEN: Soft, nontender, nondistended, gravid. EXTREMITIES: Nontender, no edema, 2+ distal pulses. Cervical Exam: Dilation: 4 Effacement (%): 80 Station: -1 Presentation: Vertex Exam by:: Gilberto Better, RN FHT:  Baseline rate 151 bpm   Variability moderate  Accelerations present   Decelerations none Contractions: Every 2-4 mins,  moderate to palpation   Pertinent Labs/Studies:   No results found for this or any previous visit (from the past 24 hour(s)).  Assessment : Orvella Digiulio is a 23 y.o. G1P0 at [redacted]w[redacted]d being admitted for labor.  Plan: Labor: Expectant management.  Induction/Augmentation as needed, per protocol FWB: Reassuring fetal heart tracing.  GBS negative Delivery plan: Hopeful for vaginal delivery  Estephany Perot, CNM [facility]

## 2019-09-17 NOTE — Progress Notes (Signed)
Krista Garcia is a 23 y.o. G1P0 at [redacted]w[redacted]d by LMP admitted for active labor  Subjective: Reports pain a 10 on pain scale 0-10, declines pain medication at this time  Objective: BP 125/72 (BP Location: Left Arm)   Pulse 81   Temp 98.9 F (37.2 C) (Oral)   Resp 20  No intake/output data recorded. No intake/output data recorded.  FHT:  FHR: 155 bpm, variability: moderate,  accelerations:  Present,  decelerations:  Absent UC:   regular, every 2-3 minutes, moderate to palpation SVE:   Dilation: 4 Effacement (%): 80 Station: -1 Exam by:: Gilberto Better, RN  Labs: Lab Results  Component Value Date   WBC 13.7 (H) 09/17/2019   HGB 11.7 (L) 09/17/2019   HCT 34.6 (L) 09/17/2019   MCV 87.8 09/17/2019   PLT 267 09/17/2019    Assessment / Plan: Spontaneous labor, progressing normally  Labor: Progressing normally Preeclampsia:  labs stable Fetal Wellbeing:  Category I Pain Control:  Labor support without medications I/D:  n/a Anticipated MOD:  NSVD  Fatiha Guzy N Rease Swinson 09/17/2019, 2:11 AM

## 2019-09-18 LAB — CBC
HCT: 24.7 % — ABNORMAL LOW (ref 36.0–46.0)
Hemoglobin: 8.3 g/dL — ABNORMAL LOW (ref 12.0–15.0)
MCH: 30.1 pg (ref 26.0–34.0)
MCHC: 33.6 g/dL (ref 30.0–36.0)
MCV: 89.5 fL (ref 80.0–100.0)
Platelets: 205 10*3/uL (ref 150–400)
RBC: 2.76 MIL/uL — ABNORMAL LOW (ref 3.87–5.11)
RDW: 14.1 % (ref 11.5–15.5)
WBC: 20.7 10*3/uL — ABNORMAL HIGH (ref 4.0–10.5)
nRBC: 0 % (ref 0.0–0.2)

## 2019-09-18 LAB — HEPATITIS B SURFACE ANTIGEN: Hepatitis B Surface Ag: NONREACTIVE

## 2019-09-18 MED ORDER — CEPHALEXIN 500 MG PO CAPS
500.0000 mg | ORAL_CAPSULE | Freq: Two times a day (BID) | ORAL | Status: DC
  Administered 2019-09-18 – 2019-09-19 (×3): 500 mg via ORAL
  Filled 2019-09-18 (×4): qty 1

## 2019-09-18 NOTE — Progress Notes (Signed)
Post Partum Day 1 Subjective: up ad lib, voiding and complains of headache all night, body aches, dizziness with ambulation (resolves when gets back in bed) and in general doesn't feel well  Objective: Blood pressure 108/63, pulse 94, temperature 98.2 F (36.8 C), temperature source Oral, resp. rate 18, SpO2 98 %, unknown if currently breastfeeding.  Physical Exam:  General: cooperative, appears stated age, fatigued and pale Lochia: appropriate Uterine Fundus: firm Incision: NA DVT Evaluation: No evidence of DVT seen on physical exam. Negative Homan's sign.  Recent Labs    09/17/19 0110 09/18/19 0638  HGB 11.7* 8.3*  HCT 34.6* 24.7*    Assessment/Plan: Plan for discharge tomorrow, will obtain blood and urine cultures and continue monitoring vital signs. Will start po antibiotics due to h/o pyelonephritis. Infant feeding Breast/ Bottle; Both    LOS: 1 day   Melody N Shambley 09/18/2019, 10:45 AM

## 2019-09-19 LAB — CBC WITH DIFFERENTIAL/PLATELET
Abs Immature Granulocytes: 0.88 10*3/uL — ABNORMAL HIGH (ref 0.00–0.07)
Basophils Absolute: 0.1 10*3/uL (ref 0.0–0.1)
Basophils Relative: 0 %
Eosinophils Absolute: 0.1 10*3/uL (ref 0.0–0.5)
Eosinophils Relative: 1 %
HCT: 24.6 % — ABNORMAL LOW (ref 36.0–46.0)
Hemoglobin: 8.1 g/dL — ABNORMAL LOW (ref 12.0–15.0)
Immature Granulocytes: 5 %
Lymphocytes Relative: 20 %
Lymphs Abs: 3.7 10*3/uL (ref 0.7–4.0)
MCH: 29.7 pg (ref 26.0–34.0)
MCHC: 32.9 g/dL (ref 30.0–36.0)
MCV: 90.1 fL (ref 80.0–100.0)
Monocytes Absolute: 1.4 10*3/uL — ABNORMAL HIGH (ref 0.1–1.0)
Monocytes Relative: 7 %
Neutro Abs: 12.6 10*3/uL — ABNORMAL HIGH (ref 1.7–7.7)
Neutrophils Relative %: 67 %
Platelets: 226 10*3/uL (ref 150–400)
RBC: 2.73 MIL/uL — ABNORMAL LOW (ref 3.87–5.11)
RDW: 14.2 % (ref 11.5–15.5)
WBC: 18.7 10*3/uL — ABNORMAL HIGH (ref 4.0–10.5)
nRBC: 0.2 % (ref 0.0–0.2)

## 2019-09-19 MED ORDER — MEDROXYPROGESTERONE ACETATE 150 MG/ML IM SUSP: 150.0000 mg | Freq: Once | INTRAMUSCULAR | 3 refills | Status: DC

## 2019-09-19 MED ORDER — IBUPROFEN 600 MG PO TABS: 600.0000 mg | ORAL_TABLET | Freq: Four times a day (QID) | ORAL | 0 refills | Status: DC

## 2019-09-19 MED ORDER — FERROUS SULFATE 325 (65 FE) MG PO TABS: 325.0000 mg | ORAL_TABLET | Freq: Two times a day (BID) | ORAL | 3 refills | Status: DC

## 2019-09-19 MED ORDER — MEDROXYPROGESTERONE ACETATE 150 MG/ML IM SUSP
150.0000 mg | Freq: Once | INTRAMUSCULAR | Status: DC
  Filled 2019-09-19: qty 1

## 2019-09-19 MED ORDER — CEPHALEXIN 500 MG PO CAPS: 500.0000 mg | ORAL_CAPSULE | Freq: Two times a day (BID) | ORAL | 0 refills | Status: AC

## 2019-09-19 NOTE — Final Progress Note (Signed)
Discharge Day SOAP Note:  Progress Note - Vaginal Delivery  Krista Garcia is a 23 y.o. G1P1001 now PP day 2 s/p Vaginal, Spontaneous . Delivery was uncomplicated. Postpartum day one pt complained of headache all night, body aches, dizziness with ambulation (resolves when gets back in bed) and in general did not feel well. Today , she is feeling better , no longer feeling dizzy , headache resolves with tylenol and motrin. She is taking keflex for hx recurrent uti in pregnancy. She will continue x 1 wk or until culture results reviewed.   Subjective  The patient has the following complaints: has no unusual complaints  Pain is controlled with current medications.   Patient is urinating without difficulty.  She is ambulating well.     Objective  Vital signs: BP (!) 99/55 (BP Location: Left Arm) Comment: notified nurse Orie Rout  Pulse 78   Temp 97.8 F (36.6 C) (Oral)   Resp 16   SpO2 99%   Breastfeeding Unknown   Physical Exam: Gen: NAD Fundus Fundal Tone: Firm  Lochia Amount: Small  Perineum Appearance: Approximated     Data Review Labs: CBC Latest Ref Rng & Units 09/19/2019 09/18/2019 09/17/2019  WBC 4.0 - 10.5 K/uL 18.7(H) 20.7(H) 13.7(H)  Hemoglobin 12.0 - 15.0 g/dL 8.1(L) 8.3(L) 11.7(L)  Hematocrit 36.0 - 46.0 % 24.6(L) 24.7(L) 34.6(L)  Platelets 150 - 400 K/uL 226 205 267   O POS  Assessment/Plan  Active Problems:   Labor and delivery, indication for care    Plan for discharge today.   Discharge Instructions: Per After Visit Summary. Activity: Advance as tolerated. Pelvic rest for 6 weeks.  Also refer to After Visit Summary Diet: Regular Medications: Allergies as of 09/19/2019   No Known Allergies     Medication List    STOP taking these medications   nitrofurantoin (macrocrystal-monohydrate) 100 MG capsule Commonly known as: Macrobid     TAKE these medications   cephALEXin 500 MG capsule Commonly known as:  KEFLEX Take 1 capsule (500 mg total) by mouth every 12 (twelve) hours for 7 days.   docusate sodium 100 MG capsule Commonly known as: COLACE Take 1 capsule (100 mg total) by mouth 2 (two) times daily.   ferrous sulfate 325 (65 FE) MG tablet Take 1 tablet (325 mg total) by mouth 2 (two) times daily with a meal.   ibuprofen 600 MG tablet Commonly known as: ADVIL Take 1 tablet (600 mg total) by mouth every 6 (six) hours.   prenatal multivitamin Tabs tablet Take 1 tablet by mouth daily at 12 noon.      Outpatient follow up: 6 wks with Melody Shambley  Postpartum contraception: Depo injections   Discharged Condition: good  Discharged to: home  Newborn Data: Disposition:home with mother  Apgars: APGAR (1 MIN): 8   APGAR (5 MINS): 9   APGAR (10 MINS):    Baby Feeding: Bottle and Breast    Philip Aspen, CNM  09/19/2019 8:44 AM

## 2019-09-19 NOTE — Discharge Instructions (Signed)
Parto vaginal, cuidados de puerperio Postpartum Care After Vaginal Delivery Lea esta informacin sobre cmo cuidarse desde el momento en que nazca su beb y hasta 6 a 12 semanas despus del parto (perodo del posparto). El mdico tambin podr darle instrucciones ms especficas. Comunquese con su mdico si tiene problemas o preguntas. Siga estas indicaciones en su casa: Hemorragia vaginal  Es normal tener un poco de hemorragia vaginal (loquios) despus del parto. Use un apsito sanitario para el sangrado vaginal y secrecin. ? Durante la primera semana despus del parto, la cantidad y el aspecto de los loquios a menudo es similar a las del perodo menstrual. ? Durante las siguientes semanas disminuir gradualmente hasta convertirse en una secrecin seca amarronada o amarillenta. ? En la mayora de las mujeres, los loquios se detienen completamente entre 4 a 6semanas despus del parto. Los sangrados vaginales pueden variar de mujer a mujer.  Cambie los apsitos sanitarios con frecuencia. Observe si hay cambios en el flujo, como: ? Un aumento repentino en el volumen. ? Cambio en el color. ? Cogulos sanguneos grandes.  Si expulsa un cogulo de sangre por la vagina, gurdelo y llame al mdico para informrselo. No deseche los cogulos de sangre por el inodoro antes de hablar con su mdico.  No use tampones ni se haga duchas vaginales hasta que el mdico la autorice.  Si no est amamantando, volver a tener su perodo entre 6 y 8 semanas despus del parto. Si solamente alimenta al beb con leche materna (lactancia materna exclusiva), podra no volver a tener su perodo hasta que deje de amamantar. Cuidados perineales  Mantenga la zona entre la vagina y el ano (perineo) limpia y seca, como se lo haya indicado el mdico. Utilice apsitos o aerosoles analgsicos y cremas, como se lo hayan indicado.  Si le hicieron un corte en el perineo (episiotoma) o tuvo un desgarro en la vagina, controle la  zona para detectar signos de infeccin hasta que sane. Est atenta a los siguientes signos: ? Aumento del enrojecimiento, la hinchazn o el dolor. ? Presenta lquido o sangre que supura del corte o desgarro. ? Calor. ? Pus o mal olor.  Es posible que le den una botella rociadora para que use en lugar de limpiarse el rea con papel higinico despus de usar el bao. Cuando comience a sanar, podr usar la botella rociadora antes de secarse. Asegrese de secarse suavemente.  Para aliviar el dolor causado por una episiotoma, un desgarro en la vagina o venas hinchadas en el ano (hemorroides), trate de tomar un bao de asiento tibio 2 o 3 veces por da. Un bao de asiento es un bao de agua tibia que se toma mientras se est sentado. El agua solo debe llegar hasta las caderas y cubrir las nalgas. Cuidado de las mamas  En los primeros das despus del parto, las mamas pueden sentirse pesadas, llenas e incmodas (congestin mamaria). Tambin puede escaparse leche de sus senos. El mdico puede sugerirle mtodos para aliviar este malestar. La congestin mamaria debera desaparecer al cabo de unos das.  Si est amamantando: ? Use un sostn que sujete y ajuste bien sus pechos. ? Mantenga los pezones secos y limpios. Aplquese cremas y ungentos, como se lo haya indicado el mdico. ? Es posible que deba usar discos de algodn en el sostn para absorber la leche que se filtre de sus senos. ? Puede tener contracciones uterinas cada vez que amamante durante varias semanas despus del parto. Las contracciones uterinas ayudan al tero a   regresar a su tamao habitual. ? Si tiene algn problema con la lactancia materna, colabore con el mdico o un asesor en lactancia.  Si no est amamantando: ? Evite tocarse mucho las mamas. Al hacerlo, podran producir ms leche. ? Use un sostn que le proporcione el ajuste correcto y compresas fras para reducir la hinchazn. ? No extraiga (saque) leche materna. Esto har que  produzca ms leche. Intimidad y sexualidad  Pregntele al mdico cundo puede retomar la actividad sexual. Esto puede depender de lo siguiente: ? Su riesgo de sufrir infecciones. ? La rapidez con la que est sanando. ? Su comodidad y deseo de retomar la actividad sexual.  Despus del parto, puede quedar embarazada incluso si no ha tenido todava su perodo. Si lo desea, hable con el mdico acerca de los mtodos de control de la natalidad (mtodos anticonceptivos). Medicamentos  Tome los medicamentos de venta libre y los recetados solamente como se lo haya indicado el mdico.  Si le recetaron un antibitico, tmelo como se lo haya indicado el mdico. No deje de tomar el antibitico aunque comience a sentirse mejor. Actividad  Retome sus actividades normales de a poco como se lo haya indicado el mdico. Pregntele al mdico qu actividades son seguras para usted.  Descanse todo lo que pueda. Trate de descansar o tomar una siesta mientras el beb duerme. Comida y bebida   Beba suficiente lquido como para mantener la orina de color amarillo plido.  Coma alimentos ricos en fibras todos los das. Estos pueden ayudarla a prevenir o aliviar el estreimiento. Los alimentos ricos en fibras incluyen, entre otros: ? Panes y cereales integrales. ? Arroz integral. ? Frijoles. ? Frutas y verduras frescas.  No intente perder de peso rpidamente reduciendo el consumo de caloras.  Tome sus vitaminas prenatales hasta la visita de seguimiento de posparto o hasta que su mdico le indique que puede dejar de tomarlas. Estilo de vida  No consuma ningn producto que contenga nicotina o tabaco, como cigarrillos y cigarrillos electrnicos. Si necesita ayuda para dejar de fumar, consulte al mdico.  No beba alcohol, especialmente si est amamantando. Instrucciones generales  Concurra a todas las visitas de seguimiento para usted y el beb, como se lo haya indicado el mdico. La mayora de las mujeres  visita al mdico para un seguimiento de posparto dentro de las primeras 3 a 6 semanas despus del parto. Comunquese con un mdico si:  Se siente incapaz de controlar los cambios que implica tener un hijo y esos sentimientos no desaparecen.  Siente tristeza o preocupacin de forma inusual.  Las mamas se ponen rojas, le duelen o se endurecen.  Tiene fiebre.  Tiene dificultad para retener la orina o para impedir que la orina se escape.  Tiene poco inters o falta de inters en actividades que solan gustarle.  No ha amamantado nada y no ha tenido un perodo menstrual durante 12 semanas despus del parto.  Dej de amamantar al beb y no ha tenido su perodo menstrual durante 12 semanas despus de dejar de amamantar.  Tiene preguntas sobre su cuidado y el del beb.  Elimina un cogulo de sangre grande por la vagina. Solicite ayuda de inmediato si:  Siente dolor en el pecho.  Tiene dificultad para respirar.  Tiene un dolor repentino e intenso en la pierna.  Tiene dolor intenso o clicos en el la parte inferior del abdomen.  Tiene una hemorragia tan intensa de la vagina que empapa ms de un apsito en una hora. El sangrado   no debe ser ms abundante que el perodo ms intenso que haya tenido.  Dolor de cabeza intenso.  Se desmaya.  Tiene visin borrosa o Geologist, engineering.  Tiene secrecin vaginal con mal olor.  Tiene pensamientos acerca de lastimarse a usted misma o a su beb. Si alguna vez siente que puede lastimarse a usted misma o a Producer, television/film/video, o tiene pensamientos de poner fin a su vida, busque ayuda de inmediato. Puede dirigirse al departamento de emergencias ms cercano o llamar a:  El servicio de emergencias de su localidad (911 en [place]  Una lnea de asistencia al suicida y Freight forwarder en crisis, como la [employer] de Prevencin del Suicidio (National Suicide Prevention Lifeline), al [phone]. Est disponible las 24 horas del da. Resumen  El  perodo de tiempo justo despus el parto y [place] 31 a 12 semanas despus del parto se denomina perodo posparto.  Retome sus actividades normales de a Theme park manager se lo haya indicado el mdico.  Concurra a todas las visitas de seguimiento para usted y Photographer beb, como se lo haya indicado el mdico. Esta informacin no tiene Marine scientist el consejo del mdico. Asegrese de hacerle al mdico cualquier pregunta que tenga. Document Released: 09/28/2007 Document Revised: 03/13/2018 Document Reviewed: 11/22/2017 Elsevier Patient Education  [place] cmo lograr un buen agarre para Economist Breastfeeding Tips for a Good Latch El agarre, o que el beb "se prenda", se refiere a cmo la boca del beb se agarra al pezn al [employer]. Es una parte importante de la Transport planner. El beb puede tener problemas para prenderse a la mama por diversos motivos. Si el beb no se prende bien, a usted se Theme park manager o [employer], o puede tener otros [place]. Siga estas indicaciones en su casa: Cmo ubicar al beb  Busque un lugar cmodo para sentarse o acostarse. Es importante que tenga bien apoyados el cuello y la espalda.  Si est sentada, coloque una almohada o una manta enrollada debajo del beb. De este modo, lo acercar al nivel de su mama.  Asegrese de que el vientre (abdomen) del beb est contra el suyo.  Pruebe diferentes posiciones hasta encontrar una que funcione tanto para usted como para el beb. Cmo ayudar a que el beb tenga un buen agarre   Para comenzar, frtese la mama con suavidad. Con las yemas de los dedos, masajee la pared del pecho hacia el pezn en un movimiento circular. Esto estimula el flujo de la [place]. Si es necesario, contine Midwife.  Posicione su mama. Sostenga la mama con el pulgar por arriba del pezn y los otros cuatro dedos por debajo de la mama. Mantenga los dedos lejos del pezn y de la boca del beb. Siga  estos pasos para ayudar al beb a prenderse: 1. Frote suavemente los labios del beb con el pezn o con el dedo. 2. Cuando la boca del beb se abra lo suficiente, acrquelo rpidamente a la mama e introduzca todo el pezn dentro de la boca del beb. Introduzca en la boca del beb tanto de la zona de color que rodea el pezn (la arola)como sea posible. 3. La lengua del beb debe estar entre la enca inferior y la [place]. 4. Debe poder ver ms arola por arriba del labio superior del beb que por debajo del labio inferior. 5. Cuando el beb empiece a succionar, sentir un jaln suave en el pezn. No debera sentir nada de dolor. Tenga paciencia.  Es comn que un beb succione durante 2 o antes de que comience el flujo de [place]. 6. Asegrese de que la boca del beb est posicionada correctamente alrededor del pezn. Los labios del beb deben crear un sello sobre la mama y estar orientados hacia fuera.  Instrucciones generales  Busque estos signos de que el beb se ha prendido bien al pezn: ? El beb tironea o succiona con tranquilidad, sin causarle dolor. ? Oye que el beb traga despus de succionar 3 o 4 veces. ? Ve movimiento por arriba y delante de las orejas del beb mientras succiona.  Preste atencin a estos signos de que el beb no se ha prendido bien al pezn: ? El beb hace sonidos al succionar o chasquidos mientras amamanta. ? Tiene dolor en los pezones.  Si el beb no est bien prendido, introduzca el [employer] del beb y el pezn. De este modo romper el sello. Luego, intente ayudar al beb a prenderse otra vez.  Si contina teniendo problemas, solicite ayuda a un especialista en lactancia (asesor en Market researcher). Comunquese con un mdico si:  Presenta agrietamiento o irritacin en los pezones durante ms de 1semana.  Tiene dolor en los pezones.  Tiene demasiada [employer] (congestin [place]) y no mejora despus de un lapso de 48 a 72  horas.  Tiene una obstruccin en un conducto mamario y [place].  Sigue los consejos para que el beb se prenda bien, pero contina teniendo problemas o preocupaciones.  Observa que sale un lquido similar al pus por la mama.  Su beb no aumenta de peso.  Su beb pierde peso. Resumen  El agarre, o que el beb "se prenda", se refiere a cmo la boca del beb se agarra al pezn al [employer].  Pruebe posiciones diferentes para Copy una que funcione tanto para usted como para el beb.  Si el beb no se prende bien, a usted se Associate Professor o [employer], o puede tener otros [employer]. Esta informacin no tiene Theme park manager el consejo del mdico. Asegrese de hacerle al mdico cualquier pregunta que tenga. Document Released: 08/27/2017 Document Revised: 08/27/2017 Document Reviewed: 08/27/2017 Elsevier Patient Education  2020 [employer].

## 2019-09-19 NOTE — Lactation Note (Signed)
This note was copied from a baby's chart. Lactation Consultation Note  Patient Name: Girl Latica Hohmann [id number] Date: 09/19/2019 Reason for consult: Follow-up assessment;Mother's request;1st time breastfeeding  LC called for assistance with mom breastfeeding due to reported nipple pain. Mom was in hunched over position at foot of the bed attempting to breastfeed baby in football hold. Baby had what appeared to be a flanged bottom lip, but upper lip tucked in. Mom reported continual pain.  [place] asked permission to remove baby, with help of [facility] intern mom was repositioned comfortably in bed with support pillows behind her back and under her right arm.  LC assisted with repositioning baby in football position, nose to nipple, with tummy turned in towards mom. Baby initially gave a shallow open mouth and latched just on nipple. LC explained the need for wider mouth, and baby tried again. Baby gave wide open mouth, and had flanged top and bottom lip. Mom reported initial pain that subsided as breastfeeding continued.  LC explained shallow versus deep latch, tips to achieve a deep latch, and importance of flanged lips. Baby had what appeared to be rhythmic sucking, with few if any swallows, even with stimulation. Baby unlatched, [place] assisted with alternate position of cross-cradle, helping mom with having head support with use of opposite hand. Baby re-latched, but could not sustain latch. LC noted nipple shield had been given, but mom unsure. LC performed a finger test with infant, infant did a lot of chomp motion up/down on finger instead of using tongue to cup finger with rhythmic motion, LC notes difficulty with moving tongue side to side, but can extend slightly beyond the gum line.  [place] asked to use nipple shield, mom agreed, size 11mm nipple shield placed for baby in cross-cradle position. Baby was able to self latch, had wide open lips, and what appeared to be rhythmic sucking pattern, but still  with few swallows. After feeding attempt of 25 minutes mom expressed desire for a break, and asked to give bottle. 47ml formula given with slow flow nipple, baby was able to suck well from bottle. LC reviewed paced bottle position and feeding technique, and importance of breast given before bottle. Encouraged mom to continue offering the breast each time baby displayed hunger cues, continue use of coconut oil or expressed colostrum to aid in nipple healing, and transient nipple pain. Reviewed hunger cues, signs of fullness, calming with skin to skin. LC left name/number on white board, encouraged to call with ongoing assistance.  Maternal Data Formula Feeding for Exclusion: No Has patient been taught Hand Expression?: Yes Does the patient have breastfeeding experience prior to this delivery?: No  Feeding Feeding Type: Breast Fed  LATCH Score Latch: Repeated attempts needed to sustain latch, nipple held in mouth throughout feeding, stimulation needed to elicit sucking reflex.  Audible Swallowing: A few with stimulation  Type of Nipple: Everted at rest and after stimulation  Comfort (Breast/Nipple): Filling, red/small blisters or bruises, mild/mod discomfort  Hold (Positioning): Assistance needed to correctly position infant at breast and maintain latch.  LATCH Score: 6  Interventions Interventions: Breast feeding basics reviewed;Assisted with latch;Hand express;Breast compression;Support pillows;Adjust position  Lactation Tools Discussed/Used Tools: Nipple Shields;Coconut oil Nipple shield size: 20 WIC Program: No(information [facility] to [facility])   Consult Status Consult Status: Follow-up Date: 09/19/19 Follow-up type: In-patient    Lavonia Drafts 09/19/2019, 3:49 PM

## 2019-09-19 NOTE — Discharge Summary (Signed)
Discharge Summary  Date of Admission: 09/16/2019  Date of Discharge: 09/19/2019  Admitting Diagnosis: Onset of Labor at [redacted]w[redacted]d  Mode of Delivery: normal spontaneous vaginal delivery                 Discharge Diagnosis: No other diagnosis  Intrapartum Procedures: laceration 2nd right sidewall laceration   Post partum procedures: none  Complications: none                      Discharge Day SOAP Note:  Progress Note - Vaginal Delivery  Krista Garcia is a 23 y.o. G1P1001 now PP day 2 s/p Vaginal, Spontaneous . Delivery was uncomplicated. Postpartum day one pt complained of headache all night, body aches, dizziness with ambulation (resolves when gets back in bed) and in general did not feel well. Today , she is feeling better , no longer feeling dizzy , headache resolves with tylenol and motrin. She is taking keflex for hx recurrent uti in pregnancy. She will continue x 1 wk or until culture results reviewed.   Subjective  The patient has the following complaints: has no unusual complaints  Pain is controlled with current medications.   Patient is urinating without difficulty.  She is ambulating well.     Objective  Vital signs: BP (!) 99/55 (BP Location: Left Arm) Comment: notified nurse Orie Rout  Pulse 78   Temp 97.8 F (36.6 C) (Oral)   Resp 16   SpO2 99%   Breastfeeding Unknown   Physical Exam: Gen: NAD Fundus Fundal Tone: Firm  Lochia Amount: Small  Perineum Appearance: Approximated     Data Review Labs: CBC Latest Ref Rng & Units 09/19/2019 09/18/2019 09/17/2019  WBC 4.0 - 10.5 K/uL 18.7(H) 20.7(H) 13.7(H)  Hemoglobin 12.0 - 15.0 g/dL 8.1(L) 8.3(L) 11.7(L)  Hematocrit 36.0 - 46.0 % 24.6(L) 24.7(L) 34.6(L)  Platelets 150 - 400 K/uL 226 205 267   O POS  Assessment/Plan  Active Problems:   Labor and delivery, indication for care    Plan for discharge today.   Discharge Instructions: Per After Visit Summary.  Activity: Advance as tolerated. Pelvic rest for 6 weeks.  Also refer to After Visit Summary Diet: Regular Medications: Allergies as of 09/19/2019   No Known Allergies     Medication List    STOP taking these medications   nitrofurantoin (macrocrystal-monohydrate) 100 MG capsule Commonly known as: Macrobid     TAKE these medications   cephALEXin 500 MG capsule Commonly known as: KEFLEX Take 1 capsule (500 mg total) by mouth every 12 (twelve) hours for 7 days.   docusate sodium 100 MG capsule Commonly known as: COLACE Take 1 capsule (100 mg total) by mouth 2 (two) times daily.   ferrous sulfate 325 (65 FE) MG tablet Take 1 tablet (325 mg total) by mouth 2 (two) times daily with a meal.   ibuprofen 600 MG tablet Commonly known as: ADVIL Take 1 tablet (600 mg total) by mouth every 6 (six) hours.   prenatal multivitamin Tabs tablet Take 1 tablet by mouth daily at 12 noon.      Outpatient follow up:  Postpartum contraception: Will discuss at first office visit post-partum  Discharged Condition: good  Discharged to: home  Newborn Data: Disposition:home with mother  Apgars: APGAR (1 MIN): 8   APGAR (  5 MINS): 9   APGAR (10 MINS):    Baby Feeding: Bottle and Breast    Doreene Burke, CNM  09/19/2019 8:44 AM

## 2019-09-19 NOTE — Progress Notes (Signed)
09/19/2019 8:26 PM  BP (!) 126/57 (BP Location: Right Arm)   Pulse 78   Temp 98.3 F (36.8 C) (Oral)   Resp 14   SpO2 99%   Breastfeeding Unknown  Patient discharged per MD orders. Discharge instructions reviewed with patient via interpreter and patient verbalized understanding.Prescriptions discussed and to be picked up by patient. Discharged via wheelchair escorted by nursing staff with infant.  Almedia Balls, RN

## 2019-09-19 NOTE — Lactation Note (Addendum)
This note was copied from a baby's chart. Lactation Consultation Note  Patient Name: Krista Garcia Date: 09/19/2019 Reason for consult: Initial assessment  LC introduced to mom and baby. Use of interpreter [phone] Mom delivered via c-section on 09/17/2019, and has continued to have breastfeeding difficulties. Baby has received phototherapy due to elevated bilirubin levels, and poor feeding. Parents have been supplementing with formula, appearing to alternate feeds. Mom is reporting some nipple and feeding pain during feeds and baby appearing unsatisfied. Mom believes that she doesn't have enough milk for baby.  In asking about breast history, pain, and trauma mom reports a cyst being removed prior to pregnancy, and a mid-line scar is evident across areola on the left breast which may be impacting milk supply production/milk release. LC reviewed breastfeeding basics with mom to include newborn stomach size-starting as size of a grape, and importance of breast emptying to make more milk. Reviewed position of infant at breast: wide open mouth, max amount of breast tissue, and flange lips; improvement in position may help emptying of breast to increase supply.  Plan is to work on baby feeding at breast today before discharge. [facility] name and number written on whiteboard and encouraged mom to call out at next feed for assistance.  *LC provided information for [facility] in [facility], mom agreed for [facility] to send referral over to [facility] office.  Maternal Data Has patient been taught Hand Expression?: Yes Does the patient have breastfeeding experience prior to this delivery?: No  Feeding Feeding Type: Breast Fed  LATCH Score                   Interventions Interventions: Breast feeding basics reviewed  Lactation Tools Discussed/Used     Consult Status Consult Status: Follow-up Date: 09/19/19 Follow-up type: In-patient    Lavonia Drafts 09/19/2019, 10:39 AM

## 2019-09-20 ENCOUNTER — Telehealth: Payer: Self-pay

## 2019-09-20 LAB — URINE CULTURE: Special Requests: NORMAL

## 2019-09-20 NOTE — Telephone Encounter (Signed)
Patient does not speak [place]. Message left for interpreters office to return my call.

## 2019-09-21 ENCOUNTER — Telehealth: Payer: Self-pay

## 2019-09-21 ENCOUNTER — Other Ambulatory Visit: Payer: Self-pay

## 2019-09-21 ENCOUNTER — Other Ambulatory Visit: Payer: Medicaid Other

## 2019-09-21 ENCOUNTER — Encounter: Payer: Medicaid Other | Admitting: Certified Nurse Midwife

## 2019-09-21 DIAGNOSIS — N390 Urinary tract infection, site not specified: Secondary | ICD-10-CM

## 2019-09-21 NOTE — Telephone Encounter (Signed)
Patient was informed via interpreter of ATs orders. Patient will come today for another urine c&s. Also has been in contact with the hospital regarding birth certificate.

## 2019-09-23 LAB — CULTURE, BLOOD (SINGLE)
Culture: NO GROWTH
Special Requests: ADEQUATE

## 2019-09-23 LAB — URINE CULTURE

## 2019-10-27 ENCOUNTER — Encounter: Payer: Medicaid Other | Admitting: Obstetrics and Gynecology

## 2019-10-28 ENCOUNTER — Ambulatory Visit (INDEPENDENT_AMBULATORY_CARE_PROVIDER_SITE_OTHER): Payer: Medicaid Other | Admitting: Certified Nurse Midwife

## 2019-10-28 ENCOUNTER — Encounter: Payer: Self-pay | Admitting: Certified Nurse Midwife

## 2019-10-28 ENCOUNTER — Other Ambulatory Visit: Payer: Self-pay

## 2019-10-28 DIAGNOSIS — Z3042 Encounter for surveillance of injectable contraceptive: Secondary | ICD-10-CM

## 2019-10-28 DIAGNOSIS — Z1389 Encounter for screening for other disorder: Secondary | ICD-10-CM | POA: Diagnosis not present

## 2019-10-28 DIAGNOSIS — H5713 Ocular pain, bilateral: Secondary | ICD-10-CM

## 2019-10-28 MED ORDER — MEDROXYPROGESTERONE ACETATE 150 MG/ML IM SUSP
150.0000 mg | Freq: Once | INTRAMUSCULAR | Status: AC
  Administered 2019-10-28: 150 mg via INTRAMUSCULAR

## 2019-10-28 NOTE — Addendum Note (Signed)
Addended by: Raliegh Ip on: 10/28/2019 03:26 PM   Modules accepted: Orders

## 2019-10-28 NOTE — Progress Notes (Signed)
Subjective:    Krista Garcia is a 23 y.o. G7P1001 Hispanic female who presents for a postpartum visit. She is 6 weeks postpartum following a spontaneous vaginal delivery at 39.6 gestational weeks. Anesthesia: none. I have fully reviewed the prenatal and intrapartum course. Postpartum course has been WNL. Baby's course has been WNL. Baby is feeding by breast and bottle. Bleeding no bleeding. Bowel function is normal. Bladder function is normal. Patient is not sexually active.. Contraception method is Depo-Provera injections. Postpartum depression screening: negative. Score 5.  Last pap  Unknown and was negative per pt.  The following portions of the patient's history were reviewed and updated as appropriate: allergies, current medications, past medical history, past surgical history and problem list. Pt complains that the bili light that the baby was on caused her eye pain. She would like referral. Also has some pelvic discomfort. Reviewed normal pain , discussed referral to pelvic floor therapy as needed. Also states she has a rash on her face an shoulders since delivey. She denies any changes in products. She has not tried anything to this point. Encouraged use of cortisone cream. If no improvement. Will refer to derm.   Review of Systems Pertinent items are noted in HPI.   There were no vitals filed for this visit. No LMP recorded.  Objective:   General:  alert, cooperative and no distress   Breasts:  deferred, no complaints  Lungs: clear to auscultation bilaterally  Heart:  regular rate and rhythm  Abdomen: soft, nontender   Vulva: normal  Vagina: normal vagina  Cervix:  closed  Corpus: Well-involuted  Adnexa:  Non-palpable  Rectal Exam: No  hemorrhoids        Assessment:   Postpartum exam 6 wks s/p SVD Breast and bottle feeding Depression screening Contraception counseling   Plan:  : Depo-Provera injections, 1st injection today Follow up in: 6 months for annual or  earlier if needed Labs CC, ferritin, Vitamin b 12, Vit D.  Referral ophthalmology   Philip Aspen,, CNM

## 2019-10-28 NOTE — Patient Instructions (Signed)
Preventive Care 21-25 Years Old, Female Preventive care refers to visits with your health care provider and lifestyle choices that can promote health and wellness. This includes:  A yearly physical exam. This may also be called an annual well check.  Regular dental visits and eye exams.  Immunizations.  Screening for certain conditions.  Healthy lifestyle choices, such as eating a healthy diet, getting regular exercise, not using drugs or products that contain nicotine and tobacco, and limiting alcohol use. What can I expect for my preventive care visit? Physical exam Your health care provider will check your:  Height and weight. This may be used to calculate body mass index (BMI), which tells if you are at a healthy weight.  Heart rate and blood pressure.  Skin for abnormal spots. Counseling Your health care provider may ask you questions about your:  Alcohol, tobacco, and drug use.  Emotional well-being.  Home and relationship well-being.  Sexual activity.  Eating habits.  Work and work environment.  Method of birth control.  Menstrual cycle.  Pregnancy history. What immunizations do I need?  Influenza (flu) vaccine  This is recommended every year. Tetanus, diphtheria, and pertussis (Tdap) vaccine  You may need a Td booster every 10 years. Varicella (chickenpox) vaccine  You may need this if you have not been vaccinated. Human papillomavirus (HPV) vaccine  If recommended by your health care provider, you may need three doses over 6 months. Measles, mumps, and rubella (MMR) vaccine  You may need at least one dose of MMR. You may also need a second dose. Meningococcal conjugate (MenACWY) vaccine  One dose is recommended if you are age 19-21 years and a first-year college student living in a residence hall, or if you have one of several medical conditions. You may also need additional booster doses. Pneumococcal conjugate (PCV13) vaccine  You may need  this if you have certain conditions and were not previously vaccinated. Pneumococcal polysaccharide (PPSV23) vaccine  You may need one or two doses if you smoke cigarettes or if you have certain conditions. Hepatitis A vaccine  You may need this if you have certain conditions or if you travel or work in places where you may be exposed to hepatitis A. Hepatitis B vaccine  You may need this if you have certain conditions or if you travel or work in places where you may be exposed to hepatitis B. Haemophilus influenzae type b (Hib) vaccine  You may need this if you have certain conditions. You may receive vaccines as individual doses or as more than one vaccine together in one shot (combination vaccines). Talk with your health care provider about the risks and benefits of combination vaccines. What tests do I need?  Blood tests  Lipid and cholesterol levels. These may be checked every 5 years starting at age 20.  Hepatitis C test.  Hepatitis B test. Screening  Diabetes screening. This is done by checking your blood sugar (glucose) after you have not eaten for a while (fasting).  Sexually transmitted disease (STD) testing.  BRCA-related cancer screening. This may be done if you have a family history of breast, ovarian, tubal, or peritoneal cancers.  Pelvic exam and Pap test. This may be done every 3 years starting at age 21. Starting at age 30, this may be done every 5 years if you have a Pap test in combination with an HPV test. Talk with your health care provider about your test results, treatment options, and if necessary, the need for more tests.   Follow these instructions at home: Eating and drinking   Eat a diet that includes fresh fruits and vegetables, whole grains, lean protein, and low-fat dairy.  Take vitamin and mineral supplements as recommended by your health care provider.  Do not drink alcohol if: ? Your health care provider tells you not to drink. ? You are  pregnant, may be pregnant, or are planning to become pregnant.  If you drink alcohol: ? Limit how much you have to 0-1 drink a day. ? Be aware of how much alcohol is in your drink. In the [place] one drink equals one 12 oz bottle of beer (355 mL), one 5 oz glass of wine (148 mL), or one 1 oz glass of hard liquor (44 mL). Lifestyle  Take daily care of your teeth and gums.  Stay active. Exercise for at least 30 minutes on 5 or more days each week.  Do not use any products that contain nicotine or tobacco, such as cigarettes, e-cigarettes, and chewing tobacco. If you need help quitting, ask your health care provider.  If you are sexually active, practice safe sex. Use a condom or other form of birth control (contraception) in order to prevent pregnancy and STIs (sexually transmitted infections). If you plan to become pregnant, see your health care provider for a preconception visit. What's next?  Visit your health care provider once a year for a well check visit.  Ask your health care provider how often you should have your eyes and teeth checked.  Stay up to date on all vaccines. This information is not intended to replace advice given to you by your health care provider. Make sure you discuss any questions you have with your health care provider. Document Released: 01/27/2002 Document Revised: 08/12/2018 Document Reviewed: 08/12/2018 Elsevier Patient Education  2020 Elsevier Inc.  

## 2019-10-29 LAB — CBC
Hematocrit: 38.9 % (ref 34.0–46.6)
Hemoglobin: 12.9 g/dL (ref 11.1–15.9)
MCH: 28.5 pg (ref 26.6–33.0)
MCHC: 33.2 g/dL (ref 31.5–35.7)
MCV: 86 fL (ref 79–97)
Platelets: 321 10*3/uL (ref 150–450)
RBC: 4.53 x10E6/uL (ref 3.77–5.28)
RDW: 13.1 % (ref 11.7–15.4)
WBC: 7.2 10*3/uL (ref 3.4–10.8)

## 2019-10-29 LAB — FERRITIN: Ferritin: 35 ng/mL (ref 15–150)

## 2019-10-29 LAB — VITAMIN B12: Vitamin B-12: >2000 pg/mL — ABNORMAL HIGH (ref 232–1245)

## 2019-10-29 LAB — VITAMIN D 25 HYDROXY (VIT D DEFICIENCY, FRACTURES): Vit D, 25-Hydroxy: 20 ng/mL — ABNORMAL LOW (ref 30.0–100.0)

## 2019-11-01 ENCOUNTER — Ambulatory Visit: Payer: Self-pay

## 2019-11-01 NOTE — Lactation Note (Signed)
This note was copied from a baby's chart. Lactation Consultation Note  Patient Name: Percell Belt Today's Date: 11/01/2019 Reason for consult: Mother's request;1st time breastfeeding(outpatient appointment)  Mom and baby present for outpatient lactation consult. Mom reports that at most recent pediatrician appointment she was directed to give the baby more breastmilk, less formula, and she feels that she doesn't have enough milk. Baby is 35 weeks old, breastfeeding every 2 hours, and supplemented with 3-4 oz of formula after most feeds during the day. Baby began receiving formula supplementation the day of discharge after birth. Mom does have a manual pump at home, that she is using occasionally. LC and [place] intern did a pre-feed weight of 10lb 7.9oz. Mom was able to easily express milk to encourage baby's latch, and independently latched baby in modified cradle position on left breast. Baby immediately began strong rhythmic sucking and had audible spontaneous swallows. Mom reported no pain/discomfort. [facility] intern did adjust baby's position to bring more level with mom's breast, and turned baby's tummy in towards mom for better alignment. Baby fed well for approximately 15 minutes, and moved to opposite breast again in modified cradle position with mom supporting more of baby's head and using pillows for baby's body support. Baby independently latched without any problems or discomfort in mom. Baby again fed well for additional 10 minutes before beginning to pop on/off breast. Baby seemed content, alert, but non-fussing. Post weight of 10lb8.5oz LC and [place] intern provided mom with tips and strategies to aid in boosting milk supply: -put baby to breast on demand; at every feed -use of breast compression/massage while breastfeeding to encourage baby to stay stimulated and interested -offer breast before formula -use of hand expression or manual pump post feeds for additional stimulation  with breast compression/massage -paced-bottle feeding technique to help slow down bottle feedings for baby (baby sitting up right, bottle perpendicular to baby's mouth, breaks) Educated mom on milk supply and demand: the more the breast is stimulated, and the more often that milk is removed the more milk that they body will make. Encouraged minimum stimulation of 8-12x/24 hours. Mom plans to continue with feedings, limiting formula to as needed basis based on baby's cues, and use hand pump post feeds for additional stimulation. [place] has telephone call follow-up planned for 11/08/2019 with interpreter.   Maternal Data    Feeding Feeding Type: Breast Fed  LATCH Score Latch: Grasps breast easily, tongue down, lips flanged, rhythmical sucking.  Audible Swallowing: Spontaneous and intermittent  Type of Nipple: Everted at rest and after stimulation  Comfort (Breast/Nipple): Soft / non-tender  Hold (Positioning): Assistance needed to correctly position infant at breast and maintain latch.  LATCH Score: 9  Interventions Interventions: Breast feeding basics reviewed;Support pillows;Adjust position;Hand pump  Lactation Tools Discussed/Used     Consult Status Consult Status: Complete    Lavonia Drafts 11/01/2019, 3:17 PM

## 2019-11-04 ENCOUNTER — Telehealth: Payer: Self-pay

## 2019-11-04 NOTE — Telephone Encounter (Signed)
Interpreter services attempted to contact the patient- recording states call cannot be completed.

## 2020-01-16 ENCOUNTER — Ambulatory Visit: Payer: Medicaid Other | Admitting: Certified Nurse Midwife

## 2020-01-16 ENCOUNTER — Other Ambulatory Visit: Payer: Self-pay

## 2020-01-17 ENCOUNTER — Ambulatory Visit (INDEPENDENT_AMBULATORY_CARE_PROVIDER_SITE_OTHER): Payer: Medicaid Other | Admitting: Certified Nurse Midwife

## 2020-01-17 VITALS — BP 108/57 | HR 79 | Wt 145.6 lb

## 2020-01-17 DIAGNOSIS — Z3042 Encounter for surveillance of injectable contraceptive: Secondary | ICD-10-CM | POA: Diagnosis not present

## 2020-01-17 MED ORDER — MEDROXYPROGESTERONE ACETATE 150 MG/ML IM SUSP
150.0000 mg | Freq: Once | INTRAMUSCULAR | Status: AC
  Administered 2020-01-17: 150 mg via INTRAMUSCULAR

## 2020-01-17 NOTE — Progress Notes (Signed)
Date last pap: Unknown. Last Depo-Provera: 10/28/2019. Side Effects if any: None per patient. Serum HCG indicated? N/A, patient is within dates. Depo-Provera 150 mg IM given by: Park Meo, CMA. Next appointment due 4/20-04/17/2020.  BP (!) 108/57   Pulse 79   Wt 145 lb 9.6 oz (66 kg)   LMP 12/22/2019 (Exact Date)   Breastfeeding Yes   BMI 26.63 kg/m

## 2020-01-17 NOTE — Progress Notes (Signed)
Patient forgot to pick up Depo at pharmacy, will reschedule to a different date.

## 2020-01-24 ENCOUNTER — Encounter: Payer: Self-pay | Admitting: Obstetrics and Gynecology

## 2020-02-21 ENCOUNTER — Other Ambulatory Visit: Payer: Self-pay

## 2020-02-21 ENCOUNTER — Encounter: Payer: Self-pay | Admitting: *Deleted

## 2020-02-21 ENCOUNTER — Emergency Department
Admission: EM | Admit: 2020-02-21 | Discharge: 2020-02-21 | Disposition: A | Discharge status: Home / Self Care | Payer: Medicaid Other | Attending: Emergency Medicine | Admitting: Emergency Medicine

## 2020-02-21 ENCOUNTER — Emergency Department: Payer: Medicaid Other

## 2020-02-21 DIAGNOSIS — R509 Fever, unspecified: Secondary | ICD-10-CM | POA: Diagnosis present

## 2020-02-21 DIAGNOSIS — Z20822 Contact with and (suspected) exposure to covid-19: Secondary | ICD-10-CM | POA: Diagnosis not present

## 2020-02-21 DIAGNOSIS — R1030 Lower abdominal pain, unspecified: Secondary | ICD-10-CM | POA: Diagnosis not present

## 2020-02-21 DIAGNOSIS — B349 Viral infection, unspecified: Secondary | ICD-10-CM | POA: Insufficient documentation

## 2020-02-21 LAB — CBC WITH DIFFERENTIAL/PLATELET
Abs Immature Granulocytes: 0.03 10*3/uL (ref 0.00–0.07)
Basophils Absolute: 0.1 10*3/uL (ref 0.0–0.1)
Basophils Relative: 0 %
Eosinophils Absolute: 0.1 10*3/uL (ref 0.0–0.5)
Eosinophils Relative: 1 %
HCT: 37.7 % (ref 36.0–46.0)
Hemoglobin: 12.6 g/dL (ref 12.0–15.0)
Immature Granulocytes: 0 %
Lymphocytes Relative: 31 %
Lymphs Abs: 3.5 10*3/uL (ref 0.7–4.0)
MCH: 28.7 pg (ref 26.0–34.0)
MCHC: 33.4 g/dL (ref 30.0–36.0)
MCV: 85.9 fL (ref 80.0–100.0)
Monocytes Absolute: 0.8 10*3/uL (ref 0.1–1.0)
Monocytes Relative: 7 %
Neutro Abs: 6.9 10*3/uL (ref 1.7–7.7)
Neutrophils Relative %: 61 %
Platelets: 281 10*3/uL (ref 150–400)
RBC: 4.39 MIL/uL (ref 3.87–5.11)
RDW: 13.2 % (ref 11.5–15.5)
WBC: 11.3 10*3/uL — ABNORMAL HIGH (ref 4.0–10.5)
nRBC: 0 % (ref 0.0–0.2)

## 2020-02-21 LAB — COMPREHENSIVE METABOLIC PANEL
ALT: 46 U/L — ABNORMAL HIGH (ref 0–44)
AST: 24 U/L (ref 15–41)
Albumin: 4 g/dL (ref 3.5–5.0)
Alkaline Phosphatase: 74 U/L (ref 38–126)
Anion gap: 8 (ref 5–15)
BUN: 17 mg/dL (ref 6–20)
CO2: 23 mmol/L (ref 22–32)
Calcium: 9 mg/dL (ref 8.9–10.3)
Chloride: 106 mmol/L (ref 98–111)
Creatinine, Ser: 0.62 mg/dL (ref 0.44–1.00)
GFR calc Af Amer: >60 mL/min (ref >60)
GFR calc non Af Amer: >60 mL/min (ref >60)
Glucose, Bld: 96 mg/dL (ref 70–99)
Potassium: 3.6 mmol/L (ref 3.5–5.1)
Sodium: 137 mmol/L (ref 135–145)
Total Bilirubin: 0.6 mg/dL (ref 0.3–1.2)
Total Protein: 7.2 g/dL (ref 6.5–8.1)

## 2020-02-21 LAB — URINALYSIS, COMPLETE (UACMP) WITH MICROSCOPIC
Bilirubin Urine: NEGATIVE
Glucose, UA: NEGATIVE mg/dL
Hgb urine dipstick: NEGATIVE
Ketones, ur: NEGATIVE mg/dL
Nitrite: NEGATIVE
Protein, ur: NEGATIVE mg/dL
Specific Gravity, Urine: 1.009 (ref 1.005–1.030)
pH: 6 (ref 5.0–8.0)

## 2020-02-21 LAB — POC SARS CORONAVIRUS 2 AG: SARS Coronavirus 2 Ag: NEGATIVE

## 2020-02-21 LAB — POCT PREGNANCY, URINE: Preg Test, Ur: NEGATIVE

## 2020-02-21 MED ORDER — HYDROCODONE-ACETAMINOPHEN 5-325 MG PO TABS
1.0000 | ORAL_TABLET | Freq: Once | ORAL | Status: AC
  Administered 2020-02-21: 1 via ORAL
  Filled 2020-02-21: qty 1

## 2020-02-21 NOTE — ED Provider Notes (Signed)
[facility] Emergency Department Provider Note  ____________________________________________   First MD Initiated Contact with Patient 02/21/20 1809     (approximate)  I have reviewed the triage vital signs and the nursing notes.   HISTORY  Chief Complaint Fever and Headache    HPI Krista Garcia is a 24 y.o. female presents emergency department complaint of fever and headache since last week.  Taking Tylenol without relief.  Has body aches.  Some nausea but no vomiting.  No diarrhea.  No cough or congestion.  No chest pain or shortness of breath    Past Medical History:  Diagnosis Date  . Heart murmur   . Heart murmur   . Medical history non-contributory     Patient Active Problem List   Diagnosis Date Noted  . Labor and delivery, indication for care 09/17/2019  . Pyelonephritis affecting pregnancy 09/03/2019  . Pregnancy 06/28/2019    Past Surgical History:  Procedure Laterality Date  . BREAST CYST EXCISION Left   . BREAST SURGERY      Prior to Admission medications   Medication Sig Start Date End Date Taking? Authorizing Provider  ferrous sulfate 325 (65 FE) MG tablet Take 1 tablet (325 mg total) by mouth 2 (two) times daily with a meal. 09/19/19   Doreene Burke, CNM  medroxyPROGESTERone (DEPO-PROVERA) 150 MG/ML injection Inject 1 mL (150 mg total) into the muscle once for 1 dose. 09/19/19 01/17/20  Doreene Burke, CNM  Prenatal Vit-Fe Fumarate-FA (PRENATAL MULTIVITAMIN) TABS tablet Take 1 tablet by mouth daily at 12 noon.    [provider]    Allergies Shellfish allergy  Family History  Problem Relation Age of Onset  . Diabetes Father   . Diabetes Paternal Grandmother   . Stroke Paternal Grandmother   . Diabetes Paternal Grandfather   . Breast cancer Neg Hx   . Ovarian cancer Neg Hx   . Colon cancer Neg Hx     Social History Social History   Tobacco Use  . Smoking status: Never Smoker  . Smokeless  tobacco: Never Used  Substance Use Topics  . Alcohol use: Not Currently  . Drug use: Never    Review of Systems  Constitutional: Positive fever/chills Eyes: No visual changes. ENT: No sore throat. Respiratory: Denies cough Cardiovascular: Denies chest pain Gastrointestinal: Positive for pelvic type pain in the lower abdomen, some nausea but no vomiting or diarrhea Genitourinary: Negative for dysuria.  No vaginal discharge Musculoskeletal: Negative for back pain. Skin: Negative for rash. Psychiatric: no mood changes,     ____________________________________________   PHYSICAL EXAM:  VITAL SIGNS: ED Triage Vitals [02/21/20 1741]  Enc Vitals Group     BP 131/88     Pulse Rate 80     Resp 20     Temp 99.1 F (37.3 C)     Temp Source Oral     SpO2 98 %     Weight 147 lb 11.3 oz (67 kg)     Height 5\' 6"  (1.676 m)     Head Circumference      Peak Flow      Pain Score 9     Pain Loc      Pain Edu?      Excl. in GC?     Constitutional: Alert and oriented. Well appearing and in no acute distress. Eyes: Conjunctivae are normal.  Head: Atraumatic. Nose: No congestion/rhinnorhea. Mouth/Throat: Mucous membranes are moist.  Throat appears normal Neck:  supple no  lymphadenopathy noted Cardiovascular: Normal rate, regular rhythm. Heart sounds are normal Respiratory: Normal respiratory effort.  No retractions, lungs c t a  Abd: soft tender in the right and lower quadrant, bs normal all 4 quad GU: deferred by the patient Musculoskeletal: FROM all extremities, warm and well perfused Neurologic:  Normal speech and language.  Skin:  Skin is warm, dry and intact. No rash noted. Psychiatric: Mood and affect are normal. Speech and behavior are normal.  ____________________________________________   LABS (all labs ordered are listed, but only abnormal results are displayed)  Labs Reviewed  URINALYSIS, COMPLETE (UACMP) WITH MICROSCOPIC - Abnormal; Notable for the following  components:      Result Value   Color, Urine STRAW (*)    APPearance CLEAR (*)    Leukocytes,Ua TRACE (*)    Bacteria, UA RARE (*)    All other components within normal limits  CBC WITH DIFFERENTIAL/PLATELET - Abnormal; Notable for the following components:   WBC 11.3 (*)    All other components within normal limits  COMPREHENSIVE METABOLIC PANEL - Abnormal; Notable for the following components:   ALT 46 (*)    All other components within normal limits  SARS CORONAVIRUS 2 (TAT 6-24 HRS)  POC URINE PREG, ED  POC SARS CORONAVIRUS 2 AG -  ED  POCT PREGNANCY, URINE  POC SARS CORONAVIRUS 2 AG   ____________________________________________   ____________________________________________  RADIOLOGY  CT abdomen/pelvis is negative for appendicitis or renal stones, kidneys and bladder appear normal other than nonmobile calculi  ____________________________________________   PROCEDURES  Procedure(s) performed: Vicodin 1 p.o.   Procedures    ____________________________________________   INITIAL IMPRESSION / ASSESSMENT AND PLAN / ED COURSE  Pertinent labs & imaging results that were available during my care of the patient were reviewed by me and considered in my medical decision making (see chart for details).   Patient is a 24 year old female presents emergency department complaining of fever, headache, body aches and chills along with lower abdominal pain.  HPI  Physical exam shows patient to appear slightly uncomfortable.  Right and left lower quadrants are mildly tender to palpation.  Remainder the exam is basically unremarkable  DDx: Covid, influenza, UTI, pyelonephritis  Covid POC test ordered, UA, POC pregnancy  CBC has elevated WBC of 11.3, comprehensive metabolic panel is normal, POC Covid test is negative, urinalysis is normal, POC pregnancy is negative  Due to the patient's abdominal pain I did do a CT abdomen/pelvis.  We did not use contrast as she is allergic  to shellfish.  CT does not show an appendicitis or ureteral stone.  Bladder appears normal.  Nonmobile renal calculi were noted.  Explained all of the findings to the patient.  She was given a work note stating that her Covid test is pending.  Covid instructions were given to the patient via the interpreter.  She is to return to the emergency department if worsening headache/fever.  Follow-up with your regular doctor if not improving in 2 to 3 days.  Take over-the-counter ibuprofen and Tylenol for pain/fever.  Drink plenty of fluids.  If her Covid test is positive she should remain quarantined for an additional 10 days.  If negative she may return to work when she has been afebrile for 24 to 48 hours.  She states she understands will comply.  She was discharged in stable condition.    Krista Garcia was evaluated in Emergency Department on 02/21/2020 for the symptoms described in the history of present  illness. She was evaluated in the context of the global COVID-19 pandemic, which necessitated consideration that the patient might be at risk for infection with the SARS-CoV-2 virus that causes COVID-19. Institutional protocols and algorithms that pertain to the evaluation of patients at risk for COVID-19 are in a state of rapid change based on information released by regulatory bodies including the CDC and federal and state organizations. These policies and algorithms were followed during the patient's care in the ED.   As part of my medical decision making, I reviewed the following data within the electronic MEDICAL RECORD NUMBER Nursing notes reviewed and incorporated, Labs reviewed , Old chart reviewed, Radiograph reviewed , Notes from prior ED visits and [place] Controlled Substance Database  ____________________________________________   FINAL CLINICAL IMPRESSION(S) / ED DIAGNOSES  Final diagnoses:  Suspected COVID-19 virus infection  Viral illness  Lower abdominal pain      NEW  MEDICATIONS STARTED DURING THIS VISIT:  Discharge Medication List as of 02/21/2020  9:03 PM       Note:  This document was prepared using Dragon voice recognition software and may include unintentional dictation errors.    Faythe Ghee, PA-C 02/21/20 2228    Shaune Pollack, MD 02/26/20 2156

## 2020-02-21 NOTE — Discharge Instructions (Addendum)
Follow-up with your regular doctor if not improving in 2 to 3 days.  Return emergency department worsening.  Take Tylenol and ibuprofen for pain and fever as needed.  Drink plenty of water.

## 2020-02-21 NOTE — ED Triage Notes (Signed)
Pt reports fever and headache since last week.  Pt reports taking tylenol without relief.  Pt also has bodyaches.  No cough. Pt alert  Speech clear.

## 2020-02-21 NOTE — ED Notes (Signed)
Interpreter on line used to triage pt

## 2020-02-22 LAB — SARS CORONAVIRUS 2 (TAT 6-24 HRS): SARS Coronavirus 2: NEGATIVE

## 2020-03-22 ENCOUNTER — Telehealth: Payer: Self-pay | Admitting: Certified Nurse Midwife

## 2020-03-22 ENCOUNTER — Other Ambulatory Visit: Payer: Self-pay

## 2020-03-22 MED ORDER — MEDROXYPROGESTERONE ACETATE 150 MG/ML IM SUSP: 150.0000 mg | Freq: Once | INTRAMUSCULAR | 3 refills | Status: DC

## 2020-03-22 NOTE — Telephone Encounter (Signed)
Refill on Depo sent to patients pharmacy per patient request.

## 2020-03-22 NOTE — Telephone Encounter (Signed)
Pt came into practice, called language line the pt needs a refill of her depo injection sent to [facility] [place]. Please advise

## 2020-04-09 ENCOUNTER — Other Ambulatory Visit: Payer: Self-pay

## 2020-04-09 ENCOUNTER — Ambulatory Visit (INDEPENDENT_AMBULATORY_CARE_PROVIDER_SITE_OTHER): Payer: Medicaid Other | Admitting: Certified Nurse Midwife

## 2020-04-09 VITALS — BP 100/65 | HR 73 | Ht 66.0 in | Wt 149.2 lb

## 2020-04-09 DIAGNOSIS — Z3042 Encounter for surveillance of injectable contraceptive: Secondary | ICD-10-CM

## 2020-04-09 MED ORDER — MEDROXYPROGESTERONE ACETATE 150 MG/ML IM SUSP
150.0000 mg | Freq: Once | INTRAMUSCULAR | Status: AC
  Administered 2020-04-09: 09:00:00 150 mg via INTRAMUSCULAR

## 2020-04-09 NOTE — Progress Notes (Signed)
Date last pap: unknown. Last Depo-Provera: 01/17/2020 Side Effects if any: none. Serum HCG indicated? n/a. Depo-Provera 150 mg IM given by: Shawn Route, LPN Next appointment due July 12-26, 2021.     BP 100/65   Pulse 73   Ht 5\' 6"  (1.676 m)   Wt 149 lb 3.2 oz (67.7 kg)   LMP  (LMP Unknown)   BMI 24.08 kg/m

## 2020-04-09 NOTE — Patient Instructions (Signed)
Medroxyprogesterone injection [Contraceptive] O que  este medicamento? As injees contraceptivas de MEDROXIPROGESTERONA previnem a gravidez. Proporcionam ao anticoncepcional eficaz por 3 meses. O Depo-subQ Provera 104 tambm  usado para tratar a dor relacionada  endometriose. Este medicamento pode ser usado para outros propsitos; em caso de dvidas, pergunte ao seu profissional de sade ou farmacutico. NOMES DE MARCAS COMUNS: Depo-Provera, Depo-subQ Provera 104 O que devo dizer a meu profissional de sade antes de tomar este medicamento? Precisam saber se voc tem algum dos seguintes problemas ou estados de sade:  consome bebidas alcolicas com frequncia  asma  doena dos vasos sanguneos ou histria pregressa de cogulo (trombose) nos pulmes ou nas pernas  doena dos ossos (por exemplo, osteoporose)  cncer de mama  diabetes  transtorno alimentar (anorexia nervosa ou bulimia)  presso alta  infeco pelo HIV ou AIDS  doenas renais  doenas hepticas  depresso  enxaqueca  convulses (crises convulsivas)  derrame  fumante  sangramento vaginal  reao estranha ou alergia  medroxiprogesterona  reao estranha ou alergia a outros hormnios ou medicamentos  reao estranha ou alergia a alimentos, corantes ou conservantes  est grvida ou tentando engravidar  est amamentando Como devo usar este medicamento? A injeo contraceptiva de Depo-Provera  aplicada por via intramuscular. A injeo de Depo-subQ Provera 104  aplicada por via subcutnea. Esta injeo ser aplicada por um profissional de sade. Voc no pode estar grvida no momento da injeo. A injeo costuma ser aplicada nos primeiros 5 dias aps o incio da menstruao ou 6 semanas aps o parto. Fale com seu pediatra a respeito do uso deste medicamento em crianas. Pode ser preciso tomar alguns cuidados especiais. Este [employer] j foi usado em meninas que comearam a  Armed forces training and education officer. Superdosagem: Se achar que tomou uma superdosagem deste medicamento, entre em contato imediatamente com o [facility] [place] [facility] ou v a [employer] [facility]. OBSERVAO: Este medicamento  s para voc. No compartilhe este medicamento com outras pessoas. E se eu deixar de tomar uma dose?  importante que voc no perca nenhuma dose. Para manter o efeito anticoncepcional, voc precisa tomar uma injeo a cada 3 meses. Se no puder comparecer a uma consulta, ligue para remarc-la. Se esperar mais de 13 semanas entre uma injeo anticoncepcional de Depo-Provera e [place], ou [place] de 14 semanas entre uma injeo de [place] Provera 104 e outra, voc pode [employer]. Se faltar  consulta, use outro mtodo anticoncepcional.  possvel que voc tambm precise fazer um exame de gravidez antes de receber outra injeo. O que pode interagir com este medicamento? No tome este medicamento com nenhum dos seguintes:  bosentana Este medicamento tambm pode interagir com os seguintes remdios:  aminoglutetimida  antibiticos ou medicamentos para tratamento de infeces, principalmente rifampicina, rifabutina, rifapentina e griseofulvina  aprepitanto  barbitricos, como o fenobarbital ou a primidona  bexaroteno  carbamazepina  alguns medicamentos para crises convulsivas, como etotona, felbamato, [place], [place], topiramato  modafinila  erva-de-so-joo Esta lista pode no descrever todas as interaes possveis. D ao seu profissional de sade uma lista de todos os medicamentos, ervas medicinais, remdios de venda livre, ou suplementos alimentares que voc [place]. Diga tambm se voc fuma, bebe, ou [place] drogas ilcitas. Alguns destes podem interagir com o seu medicamento. Ao que devo ficar atento quando estiver [employer] medicamento? Este medicamento no protege contra uma possvel infeco pelo HIV, AIDS ou outras doenas sexualmente transmissveis. O uso deste produto  pode fazer voc perder [employer]. A perda de clcio pode provocar fraqueza nos  ossos (osteoporose). S use este produto por mais de 2 anos se outros mtodos anticoncepcionais no forem bons para voc. Quanto mais tempo usar este produto como anticoncepcional, Airline pilot o risco de fraqueza nos ossos. Pergunte ao seu mdico ou profissional de sade como manter os seus ossos fortes. Voc pode notar uma alterao no padro [employer] ou menstruao irregular. Muitas mulheres param de Librarian, academic enquanto esto tomando este medicamento. Se tomou as injees na data certa, a probabilidade de gravidez  muito pequena. Se acha que pode estar grvida, consulte seu mdico ou profissional de sade assim que possvel. Se quiser engravidar no prximo ano, avise seu mdico ou profissional de sade. O efeito deste medicamento pode durar um bom tempo aps a sua ltima injeo. Que efeitos colaterais posso sentir aps usar este medicamento? Efeitos colaterais que devem ser informados ao seu mdico ou profissional de sade o mais rpido possvel:  reaes alrgicas, como erupo na pele, coceira, urticria, ou inchao do rosto, dos lbios ou da lngua  seios sensveis ou com secreo  dificuldade para respirar  alteraes na viso  depresso  sensao de tontura, desmaio, quedas  febre  dor no abdome, no peito, na virilha ou na perna  dificuldades de equilbrio, para falar, para andar  fraqueza ou cansao fora do comum  olhos ou pele amarelados Efeitos colaterais que normalmente no precisam de cuidados mdicos (avise ao seu mdico ou profissional de sade se persistirem ou forem incmodos):  acne  reteno de lquido e inchao  dor de cabea  menstruao irregular ou ausente, sangramento fora do ciclo  dor, coceira, ou reao temporria no local da injeo  ganho de peso Esta lista pode no descrever todos os efeitos colaterais possveis. Para mais orientaes sobre efeitos  colaterais, consulte o seu mdico. Voc pode relatar a ocorrncia de efeitos colaterais  FDA pelo telefone [phone]. Onde devo guardar meu medicamento? Isto no se aplica. Esta injeo ser aplicada por um profissional de sade. OBSERVAO: Este folheto  um resumo. Pode no cobrir todas as informaes possveis. Se tiver dvidas a respeito deste medicamento, fale com seu mdico, farmacutico ou profissional de sade.  2020 Elsevier/Gold Standard (2017-01-01 00:00:00)

## 2020-04-10 ENCOUNTER — Emergency Department: Payer: Medicaid Other

## 2020-04-10 ENCOUNTER — Other Ambulatory Visit: Payer: Self-pay

## 2020-04-10 ENCOUNTER — Emergency Department
Admission: EM | Admit: 2020-04-10 | Discharge: 2020-04-10 | Disposition: A | Discharge status: Home / Self Care | Payer: Medicaid Other | Attending: Emergency Medicine | Admitting: Emergency Medicine

## 2020-04-10 DIAGNOSIS — N760 Acute vaginitis: Secondary | ICD-10-CM | POA: Insufficient documentation

## 2020-04-10 DIAGNOSIS — R1030 Lower abdominal pain, unspecified: Secondary | ICD-10-CM | POA: Insufficient documentation

## 2020-04-10 DIAGNOSIS — Z20822 Contact with and (suspected) exposure to covid-19: Secondary | ICD-10-CM | POA: Insufficient documentation

## 2020-04-10 DIAGNOSIS — R519 Headache, unspecified: Secondary | ICD-10-CM | POA: Diagnosis not present

## 2020-04-10 DIAGNOSIS — R197 Diarrhea, unspecified: Secondary | ICD-10-CM

## 2020-04-10 DIAGNOSIS — B9689 Other specified bacterial agents as the cause of diseases classified elsewhere: Secondary | ICD-10-CM | POA: Diagnosis not present

## 2020-04-10 DIAGNOSIS — R109 Unspecified abdominal pain: Secondary | ICD-10-CM | POA: Diagnosis present

## 2020-04-10 LAB — COMPREHENSIVE METABOLIC PANEL
ALT: 27 U/L (ref 0–44)
AST: 22 U/L (ref 15–41)
Albumin: 4.1 g/dL (ref 3.5–5.0)
Alkaline Phosphatase: 87 U/L (ref 38–126)
Anion gap: 9 (ref 5–15)
BUN: 12 mg/dL (ref 6–20)
CO2: 24 mmol/L (ref 22–32)
Calcium: 9.2 mg/dL (ref 8.9–10.3)
Chloride: 104 mmol/L (ref 98–111)
Creatinine, Ser: 0.57 mg/dL (ref 0.44–1.00)
GFR calc Af Amer: >60 mL/min (ref >60)
GFR calc non Af Amer: >60 mL/min (ref >60)
Glucose, Bld: 136 mg/dL — ABNORMAL HIGH (ref 70–99)
Potassium: 3.5 mmol/L (ref 3.5–5.1)
Sodium: 137 mmol/L (ref 135–145)
Total Bilirubin: 0.6 mg/dL (ref 0.3–1.2)
Total Protein: 7.6 g/dL (ref 6.5–8.1)

## 2020-04-10 LAB — URINALYSIS, COMPLETE (UACMP) WITH MICROSCOPIC
Bilirubin Urine: NEGATIVE
Glucose, UA: NEGATIVE mg/dL
Hgb urine dipstick: NEGATIVE
Ketones, ur: NEGATIVE mg/dL
Leukocytes,Ua: NEGATIVE
Nitrite: NEGATIVE
Protein, ur: NEGATIVE mg/dL
Specific Gravity, Urine: 1.005 (ref 1.005–1.030)
pH: 7 (ref 5.0–8.0)

## 2020-04-10 LAB — CBC
HCT: 40.5 % (ref 36.0–46.0)
Hemoglobin: 13.4 g/dL (ref 12.0–15.0)
MCH: 28.5 pg (ref 26.0–34.0)
MCHC: 33.1 g/dL (ref 30.0–36.0)
MCV: 86 fL (ref 80.0–100.0)
Platelets: 270 10*3/uL (ref 150–400)
RBC: 4.71 MIL/uL (ref 3.87–5.11)
RDW: 12.2 % (ref 11.5–15.5)
WBC: 8.3 10*3/uL (ref 4.0–10.5)
nRBC: 0 % (ref 0.0–0.2)

## 2020-04-10 LAB — CHLAMYDIA/NGC RT PCR (ARMC ONLY)
Chlamydia Tr: NOT DETECTED
N gonorrhoeae: NOT DETECTED

## 2020-04-10 LAB — LIPASE, BLOOD: Lipase: 36 U/L (ref 11–51)

## 2020-04-10 LAB — SARS CORONAVIRUS 2 (TAT 6-24 HRS): SARS Coronavirus 2: NEGATIVE

## 2020-04-10 LAB — POCT PREGNANCY, URINE: Preg Test, Ur: NEGATIVE

## 2020-04-10 LAB — WET PREP, GENITAL
Sperm: NONE SEEN
Trich, Wet Prep: NONE SEEN
Yeast Wet Prep HPF POC: NONE SEEN

## 2020-04-10 MED ORDER — METRONIDAZOLE 500 MG PO TABS
500.0000 mg | ORAL_TABLET | Freq: Two times a day (BID) | ORAL | 0 refills | Status: AC
Start: 2020-04-10 — End: 2020-04-17

## 2020-04-10 MED ORDER — IBUPROFEN 600 MG PO TABS: 600.0000 mg | ORAL_TABLET | Freq: Three times a day (TID) | ORAL | 0 refills | Status: AC | PRN

## 2020-04-10 MED ORDER — ONDANSETRON HCL 4 MG/2ML IJ SOLN
4.0000 mg | Freq: Once | INTRAMUSCULAR | Status: AC
  Administered 2020-04-10: 4 mg via INTRAVENOUS
  Filled 2020-04-10: qty 2

## 2020-04-10 MED ORDER — SODIUM CHLORIDE 0.9 % IV BOLUS
1000.0000 mL | Freq: Once | INTRAVENOUS | Status: AC
  Administered 2020-04-10: 1000 mL via INTRAVENOUS

## 2020-04-10 MED ORDER — ONDANSETRON 4 MG PO TBDP
4.0000 mg | ORAL_TABLET | Freq: Three times a day (TID) | ORAL | 0 refills | Status: DC | PRN
Start: 2020-04-10 — End: 2020-12-25

## 2020-04-10 MED ORDER — KETOROLAC TROMETHAMINE 30 MG/ML IJ SOLN
15.0000 mg | Freq: Once | INTRAMUSCULAR | Status: AC
  Administered 2020-04-10: 15 mg via INTRAVENOUS
  Filled 2020-04-10: qty 1

## 2020-04-10 MED ORDER — IOHEXOL 300 MG/ML  SOLN
100.0000 mL | Freq: Once | INTRAMUSCULAR | Status: AC | PRN
  Administered 2020-04-10: 14:00:00 100 mL via INTRAVENOUS

## 2020-04-10 MED ORDER — SODIUM CHLORIDE 0.9% FLUSH: 3.0000 mL | Freq: Once | INTRAVENOUS | Status: DC

## 2020-04-10 NOTE — ED Provider Notes (Signed)
[facility] Emergency Department Provider Note  ____________________________________________   First MD Initiated Contact with Patient 04/10/20 1204     (approximate)  I have reviewed the triage vital signs and the nursing notes.   HISTORY  Chief Complaint Abdominal Pain    HPI Krista Garcia is a 24 y.o. female otherwise healthy who comes in with abdominal pain.  Patient reports having abdominal pain for the past 4 days.  The pain is all over her abdomen, moderate, constant, worse after eating, nothing makes it better.  She reports having nausea without vomiting.  No fevers.  States that every time she tries to eat something she has diarrhea.  No fevers.  Does have some mild headaches but those have been going on for months now.  Not sudden or severe in onset.  No shortness of breath or chest pain.  Patient does report some pain with sex but denies vaginal discharge.  Unclear exactly how long that is been going on for.  She denies any coronavirus contacts but is never had coronavirus before.  Denies any chance of being pregnant.  Denies any chest pain or shortness of breath.  Patient reports that with the abdominal pain she has some lower back pain.  Also reports some mild headaches but states that is been going on for months now that she is always concerned about that.  Spanish interpreter was used for history and updates           Past Medical History:  Diagnosis Date  . Heart murmur   . Heart murmur   . Medical history non-contributory     Patient Active Problem List   Diagnosis Date Noted  . Labor and delivery, indication for care 09/17/2019  . Pyelonephritis affecting pregnancy 09/03/2019  . Pregnancy 06/28/2019    Past Surgical History:  Procedure Laterality Date  . BREAST CYST EXCISION Left   . BREAST SURGERY      Prior to Admission medications   Medication Sig Start Date End Date Taking? Authorizing Provider  ferrous  sulfate 325 (65 FE) MG tablet Take 1 tablet (325 mg total) by mouth 2 (two) times daily with a meal. 09/19/19   Philip Aspen, CNM  medroxyPROGESTERone (DEPO-PROVERA) 150 MG/ML injection Inject 1 mL (150 mg total) into the muscle once for 1 dose. 03/22/20 04/09/20  Philip Aspen, CNM  Prenatal Vit-Fe Fumarate-FA (PRENATAL MULTIVITAMIN) TABS tablet Take 1 tablet by mouth daily at 12 noon.    [provider]    Allergies Shellfish allergy  Family History  Problem Relation Age of Onset  . Diabetes Father   . Diabetes Paternal Grandmother   . Stroke Paternal Grandmother   . Diabetes Paternal Grandfather   . Breast cancer Neg Hx   . Ovarian cancer Neg Hx   . Colon cancer Neg Hx     Social History Social History   Tobacco Use  . Smoking status: Never Smoker  . Smokeless tobacco: Never Used  Substance Use Topics  . Alcohol use: Not Currently  . Drug use: Never      Review of Systems Constitutional: No fever/chills Eyes: No visual changes. ENT: No sore throat. Cardiovascular: Denies chest pain. Respiratory: Denies shortness of breath. Gastrointestinal: Positive abdominal pain, nausea, diarrhea Genitourinary: Negative for dysuria. Musculoskeletal: Positive low back pain Skin: Negative for rash. Neurological: Mild headache, no focal weakness or numbness. All other ROS negative ____________________________________________   PHYSICAL EXAM:  VITAL SIGNS: ED Triage Vitals  Enc Vitals  Group     BP 04/10/20 1121 110/69     Pulse Rate 04/10/20 1121 87     Resp 04/10/20 1121 18     Temp 04/10/20 1121 98.5 F (36.9 C)     Temp src --      SpO2 04/10/20 1121 100 %     Weight 04/10/20 1126 149 lb 3.2 oz (67.7 kg)     Height 04/10/20 1126 5\' 6"  (1.676 m)     Head Circumference --      Peak Flow --      Pain Score 04/10/20 1126 5     Pain Loc --      Pain Edu? --      Excl. in GC? --     Constitutional: Alert and oriented. Well appearing and in no acute  distress. Eyes: Conjunctivae are normal. EOMI. Head: Atraumatic. Nose: No congestion/rhinnorhea. Mouth/Throat: Mucous membranes are moist.   Neck: No stridor. Trachea Midline. FROM Cardiovascular: Normal rate, regular rhythm. Grossly normal heart sounds.  Good peripheral circulation. Respiratory: Normal respiratory effort.  No retractions. Lungs CTAB. Gastrointestinal: Tenderness throughout the abdomen but most notable in the lower abdomen including the right side.  No distention. No abdominal bruits.  Musculoskeletal: No lower extremity tenderness nor edema.  No joint effusions. Neurologic:  Normal speech and language. No gross focal neurologic deficits are appreciated.  Skin:  Skin is warm, dry and intact. No rash noted. Psychiatric: Mood and affect are normal. Speech and behavior are normal. GU: Deferred   ____________________________________________   LABS (all labs ordered are listed, but only abnormal results are displayed)  Labs Reviewed  COMPREHENSIVE METABOLIC PANEL - Abnormal; Notable for the following components:      Result Value   Glucose, Bld 136 (*)    All other components within normal limits  WET PREP, GENITAL  CHLAMYDIA/NGC RT PCR ([facility] ONLY)  LIPASE, BLOOD  CBC  URINALYSIS, COMPLETE (UACMP) WITH MICROSCOPIC  POCT PREGNANCY, URINE  POC URINE PREG, ED   ____________________________________________     RADIOLOGY   Official radiology report(s): CT ABDOMEN PELVIS W CONTRAST  Result Date: 04/10/2020 CLINICAL DATA:  Back pain, abdominal pain EXAM: CT ABDOMEN AND PELVIS WITH CONTRAST TECHNIQUE: Multidetector CT imaging of the abdomen and pelvis was performed using the standard protocol following bolus administration of intravenous contrast. CONTRAST:  04/12/2020 OMNIPAQUE IOHEXOL 300 MG/ML  SOLN COMPARISON:  CT 02/21/2020 FINDINGS: Lower chest: No acute abnormality. Hepatobiliary: No focal liver abnormality is seen. No gallstones, gallbladder wall  thickening, or biliary dilatation. Pancreas: Unremarkable. No pancreatic ductal dilatation or surrounding inflammatory changes. Spleen: Normal in size without focal abnormality. Adrenals/Urinary Tract: Unremarkable adrenal glands. Kidneys enhance symmetrically. Tiny punctate 1 mm nonobstructing bilateral renal calculi. No focal renal lesion. No hydronephrosis. Mild urinary bladder wall thickening with subtle associated fat stranding. Stomach/Bowel: Stomach is within normal limits. Appendix appears normal (series 2, image 59). No evidence of bowel wall thickening, distention, or inflammatory changes. Vascular/Lymphatic: No significant vascular findings are present. No enlarged abdominal or pelvic lymph nodes. Reproductive: Uterus and bilateral adnexa are unremarkable. Other: No free air or free fluid. Small fat containing umbilical hernia. Musculoskeletal: No acute or significant osseous findings. IMPRESSION: 1. Mild urinary bladder wall thickening with subtle associated fat stranding, suspicious for cystitis. Correlate with urinalysis. 2. Symmetric bilateral renal enhancement without CT evidence of pyelonephritis. 3. Tiny punctate 1 mm nonobstructing bilateral renal calculi. 4. Small fat containing umbilical hernia. Electronically Signed   By: 04/22/2020 D.O.  On: 04/10/2020 13:59    ____________________________________________   PROCEDURES  Procedure(s) performed (including Critical Care):  Procedures   ____________________________________________   INITIAL IMPRESSION / ASSESSMENT AND PLAN / ED COURSE  Krista Garcia was evaluated in Emergency Department on 04/10/2020 for the symptoms described in the history of present illness. She was evaluated in the context of the global COVID-19 pandemic, which necessitated consideration that the patient might be at risk for infection with the SARS-CoV-2 virus that causes COVID-19. Institutional protocols and algorithms that pertain to the  evaluation of patients at risk for COVID-19 are in a state of rapid change based on information released by regulatory bodies including the CDC and federal and state organizations. These policies and algorithms were followed during the patient's care in the ED.    Patient is a well-appearing 24 year old with normal vital signs who comes in with most notable lower abdominal and back pain has been going for the past few days associate with some diarrhea and nausea.  Patient is tender in her right lower quadrant but also seems to be tender in her right upper quadrant I think the best proceed with CT imaging to get an image of her appendix, gallbladder, ovaries.  Patient does report some discomfort with sex as well but no vaginal discharge or new sexual partners.  We discussed doing a pelvic exam to evaluate for STDs as well.  We will give patient some Toradol, fluids and Zofran and reassess  Labs are reassuring.  No evidence of cholecystitis, pancreatitis, UTI, negative pregnancy test.   CT imaging was concerning for possible cystitis although patient is UA is not consistent with UTI.  Patient's pelvic exam had very minimal white discharge but no cervical motion tenderness, redness or friability of the cervix.  No significant adnexal tenderness as well.  Discussed with patient and she is only had 1 sexual partner and does not seem be high risk for PID or STDs.  However wet prep was positive for bacterial vaginosis.  We discussed treatment for this including avoiding alcohol.  We will also give some Zofran for her nausea and send Covid swab given the new diarrhea and nausea.  Patient understands quarantine and will return the ER for worsening symptoms  I discussed the provisional nature of ED diagnosis, the treatment so far, the ongoing plan of care, follow up appointments and return precautions with the patient and any family or support people present. They expressed understanding and agreed with the  plan, discharged home.   ____________________________________________   FINAL CLINICAL IMPRESSION(S) / ED DIAGNOSES   Final diagnoses:  Diarrhea, unspecified type  Bacterial vaginosis      MEDICATIONS GIVEN DURING THIS VISIT:  Medications  sodium chloride flush (NS) 0.9 % injection 3 mL ( Intravenous Canceled Entry 04/10/20 1141)  sodium chloride 0.9 % bolus 1,000 mL (0 mLs Intravenous Stopped 04/10/20 1337)  ondansetron (ZOFRAN) injection 4 mg (4 mg Intravenous Given 04/10/20 1244)  ketorolac (TORADOL) 30 MG/ML injection 15 mg (15 mg Intravenous Given 04/10/20 1257)  iohexol (OMNIPAQUE) 300 MG/ML solution 100 mL (100 mLs Intravenous Contrast Given 04/10/20 1343)     ED Discharge Orders         Ordered    metroNIDAZOLE (FLAGYL) 500 MG tablet  2 times daily     04/10/20 1505    ondansetron (ZOFRAN ODT) 4 MG disintegrating tablet  Every 8 hours PRN     04/10/20 1505           Note:  This document was prepared using Dragon voice recognition software and may include unintentional dictation errors.   Concha Se, MD 04/10/20 1505

## 2020-04-10 NOTE — Discharge Instructions (Addendum)
Your labs are reassuring.  You were positive for bacterial vaginosis which we have given you some antibiotics for.  We also given you some Zofran to help with nausea.  We are testing you for Covid.  Stay quarantine at home for 1 day.  If you could call when you were negative.  Take Tylenol 1 g every 8 hours and ibuprofen with food to help with your pain.  Return the ER for any other concerns  IMPRESSION:  1. Mild urinary bladder wall thickening with subtle associated fat  stranding, suspicious for cystitis. Correlate with urinalysis.  2. Symmetric bilateral renal enhancement without CT evidence of  pyelonephritis.  3. Tiny punctate 1 mm nonobstructing bilateral renal calculi.  4. Small fat containing umbilical hernia.

## 2020-04-10 NOTE — ED Triage Notes (Signed)
Pt comes via POV from home with c/o back pain and headache. Pt also states abdominal pain that started a few days ago.  Pt states it is worse when she eats and sends up to bathroom. Pt denies any pregnancy chance or exposure to COVID.

## 2020-04-30 ENCOUNTER — Emergency Department
Admission: EM | Admit: 2020-04-30 | Discharge: 2020-04-30 | Disposition: A | Discharge status: Home / Self Care | Payer: Medicaid Other | Attending: Emergency Medicine | Admitting: Emergency Medicine

## 2020-04-30 ENCOUNTER — Encounter: Payer: Self-pay | Admitting: Emergency Medicine

## 2020-04-30 ENCOUNTER — Other Ambulatory Visit: Payer: Self-pay

## 2020-04-30 DIAGNOSIS — K529 Noninfective gastroenteritis and colitis, unspecified: Secondary | ICD-10-CM | POA: Insufficient documentation

## 2020-04-30 DIAGNOSIS — Z79899 Other long term (current) drug therapy: Secondary | ICD-10-CM | POA: Insufficient documentation

## 2020-04-30 DIAGNOSIS — R112 Nausea with vomiting, unspecified: Secondary | ICD-10-CM | POA: Diagnosis present

## 2020-04-30 LAB — COMPREHENSIVE METABOLIC PANEL
ALT: 28 U/L (ref 0–44)
AST: 21 U/L (ref 15–41)
Albumin: 4.4 g/dL (ref 3.5–5.0)
Alkaline Phosphatase: 80 U/L (ref 38–126)
Anion gap: 9 (ref 5–15)
BUN: 15 mg/dL (ref 6–20)
CO2: 23 mmol/L (ref 22–32)
Calcium: 9 mg/dL (ref 8.9–10.3)
Chloride: 104 mmol/L (ref 98–111)
Creatinine, Ser: 0.58 mg/dL (ref 0.44–1.00)
GFR calc Af Amer: >60 mL/min (ref >60)
GFR calc non Af Amer: >60 mL/min (ref >60)
Glucose, Bld: 134 mg/dL — ABNORMAL HIGH (ref 70–99)
Potassium: 4.2 mmol/L (ref 3.5–5.1)
Sodium: 136 mmol/L (ref 135–145)
Total Bilirubin: 0.8 mg/dL (ref 0.3–1.2)
Total Protein: 7.7 g/dL (ref 6.5–8.1)

## 2020-04-30 LAB — CBC
HCT: 41.9 % (ref 36.0–46.0)
Hemoglobin: 14.4 g/dL (ref 12.0–15.0)
MCH: 28.6 pg (ref 26.0–34.0)
MCHC: 34.4 g/dL (ref 30.0–36.0)
MCV: 83.1 fL (ref 80.0–100.0)
Platelets: 299 10*3/uL (ref 150–400)
RBC: 5.04 MIL/uL (ref 3.87–5.11)
RDW: 12.7 % (ref 11.5–15.5)
WBC: 16.2 10*3/uL — ABNORMAL HIGH (ref 4.0–10.5)
nRBC: 0 % (ref 0.0–0.2)

## 2020-04-30 LAB — URINALYSIS, COMPLETE (UACMP) WITH MICROSCOPIC
Bacteria, UA: NONE SEEN
Bilirubin Urine: NEGATIVE
Glucose, UA: NEGATIVE mg/dL
Hgb urine dipstick: NEGATIVE
Ketones, ur: NEGATIVE mg/dL
Leukocytes,Ua: NEGATIVE
Nitrite: NEGATIVE
Protein, ur: NEGATIVE mg/dL
Specific Gravity, Urine: 1.02 (ref 1.005–1.030)
pH: 5 (ref 5.0–8.0)

## 2020-04-30 LAB — LIPASE, BLOOD: Lipase: 33 U/L (ref 11–51)

## 2020-04-30 LAB — POCT PREGNANCY, URINE: Preg Test, Ur: NEGATIVE

## 2020-04-30 MED ORDER — LOPERAMIDE HCL 2 MG PO CAPS
4.0000 mg | ORAL_CAPSULE | Freq: Once | ORAL | Status: AC
  Administered 2020-04-30: 4 mg via ORAL
  Filled 2020-04-30: qty 2

## 2020-04-30 MED ORDER — KETOROLAC TROMETHAMINE 30 MG/ML IJ SOLN
15.0000 mg | Freq: Once | INTRAMUSCULAR | Status: AC
  Administered 2020-04-30: 15 mg via INTRAVENOUS
  Filled 2020-04-30: qty 1

## 2020-04-30 MED ORDER — ONDANSETRON HCL 4 MG PO TABS: 4.0000 mg | ORAL_TABLET | Freq: Every day | ORAL | 0 refills | Status: DC | PRN

## 2020-04-30 MED ORDER — LOPERAMIDE HCL 2 MG PO TABS
2.0000 mg | ORAL_TABLET | Freq: Four times a day (QID) | ORAL | 0 refills | Status: DC | PRN
Start: 2020-04-30 — End: 2020-12-25

## 2020-04-30 MED ORDER — ONDANSETRON HCL 4 MG/2ML IJ SOLN
4.0000 mg | Freq: Once | INTRAMUSCULAR | Status: AC
  Administered 2020-04-30: 4 mg via INTRAVENOUS
  Filled 2020-04-30: qty 2

## 2020-04-30 MED ORDER — SODIUM CHLORIDE 0.9 % IV BOLUS
1000.0000 mL | Freq: Once | INTRAVENOUS | Status: AC
  Administered 2020-04-30: 1000 mL via INTRAVENOUS

## 2020-04-30 MED ORDER — SODIUM CHLORIDE 0.9% FLUSH: 3.0000 mL | Freq: Once | INTRAVENOUS | Status: DC

## 2020-04-30 NOTE — ED Triage Notes (Signed)
C/O vomiting and diarrhea all night and this morning.  C/O stomach pain.  Denies dysuria.

## 2020-04-30 NOTE — ED Notes (Signed)
Pt able to ambulate independently to the bathroom. Pt back in treatment room at this time. No needs verbalized with interpreter at bedside. Call bell at bedside.

## 2020-04-30 NOTE — ED Provider Notes (Signed)
[facility] Emergency Department Provider Note  Time seen: 2:01 PM  I have reviewed the triage vital signs and the nursing notes.   HISTORY  Chief Complaint Vomiting and diarrhea  HPI Krista Garcia is a 24 y.o. female with no past medical history presents to the emergency department for nausea vomiting and diarrhea.  According to the patient last night she developed nausea vomiting and diarrhea.  States her baby developed symptoms similar yesterday during the daytime.  Patient denies any fever.  Denies any specific abdominal pain but states generalized abdominal discomfort.  Denies any dysuria.  No cough or shortness of breath.  Past Medical History:  Diagnosis Date  . Heart murmur   . Heart murmur   . Medical history non-contributory     Patient Active Problem List   Diagnosis Date Noted  . Labor and delivery, indication for care 09/17/2019  . Pyelonephritis affecting pregnancy 09/03/2019  . Pregnancy 06/28/2019    Past Surgical History:  Procedure Laterality Date  . BREAST CYST EXCISION Left   . BREAST SURGERY      Prior to Admission medications   Medication Sig Start Date End Date Taking? Authorizing Provider  ferrous sulfate 325 (65 FE) MG tablet Take 1 tablet (325 mg total) by mouth 2 (two) times daily with a meal. 09/19/19   Philip Aspen, CNM  medroxyPROGESTERone (DEPO-PROVERA) 150 MG/ML injection Inject 1 mL (150 mg total) into the muscle once for 1 dose. 03/22/20 04/09/20  Philip Aspen, CNM  ondansetron (ZOFRAN ODT) 4 MG disintegrating tablet Take 1 tablet (4 mg total) by mouth every 8 (eight) hours as needed for nausea or vomiting. 04/10/20   Vanessa Pal[place]ka, MD  Prenatal Vit-Fe Fumarate-FA (PRENATAL MULTIVITAMIN) TABS tablet Take 1 tablet by mouth daily at 12 noon.    [provider]    Allergies  Allergen Reactions  . Shellfish Allergy     Family History  Problem Relation Age of Onset  . Diabetes Father   .  Diabetes Paternal Grandmother   . Stroke Paternal Grandmother   . Diabetes Paternal Grandfather   . Breast cancer Neg Hx   . Ovarian cancer Neg Hx   . Colon cancer Neg Hx     Social History Social History   Tobacco Use  . Smoking status: Never Smoker  . Smokeless tobacco: Never Used  Substance Use Topics  . Alcohol use: Not Currently  . Drug use: Never    Review of Systems Constitutional: Negative for fever. Cardiovascular: Negative for chest pain. Respiratory: Negative for shortness of breath. Gastrointestinal: Mild diffuse abdominal discomfort/cramping.  Positive for nausea vomiting diarrhea. Genitourinary: Negative for urinary compaints Musculoskeletal: Negative for musculoskeletal complaints Neurological: Negative for headache All other ROS negative  ____________________________________________   PHYSICAL EXAM:  VITAL SIGNS: ED Triage Vitals  Enc Vitals Group     BP 04/30/20 1048 107/69     Pulse Rate 04/30/20 1048 (!) 110     Resp 04/30/20 1048 16     Temp 04/30/20 1048 98.2 F (36.8 C)     Temp Source 04/30/20 1048 Oral     SpO2 04/30/20 1048 100 %     Weight 04/30/20 1051 140 lb (63.5 kg)     Height 04/30/20 1051 5\' 6"  (1.676 m)     Head Circumference --      Peak Flow --      Pain Score 04/30/20 1050 9     Pain Loc --  Pain Edu? --      Excl. in GC? --    Constitutional: Alert and oriented. Well appearing and in no distress. Eyes: Normal exam ENT      Head: Normocephalic and atraumatic.      Mouth/Throat: Mucous membranes are moist. Cardiovascular: Normal rate, regular rhythm.  Respiratory: Normal respiratory effort without tachypnea nor retractions. Breath sounds are clear  Gastrointestinal: Soft, slight diffuse tenderness without focal tenderness identified.  No rebound guarding or distention. Musculoskeletal: Nontender with normal range of motion in all extremities. Neurologic:  Normal speech and language. No gross focal neurologic deficits   Skin:  Skin is warm, dry and intact.  Psychiatric: Mood and affect are normal.   ____________________________________________   INITIAL IMPRESSION / ASSESSMENT AND PLAN / ED COURSE  Pertinent labs & imaging results that were available during my care of the patient were reviewed by me and considered in my medical decision making (see chart for details).   Patient presents to the emergency department for nausea vomiting diarrhea.  States symptoms started last night.  States her infant child has similar symptoms starting yesterday during the daytime.  Patient has mild diffuse tenderness on abdominal exam but no focal area of tenderness identified.  No rebound guarding or distention.  Patient's labs showed leukocytosis otherwise reassuring.  We will treat with fluids, Zofran, loperamide and reassess.  Given her child with similar symptoms and reassuring physical exam highly suspect gastroenteritis.  Patient states she is feeling better.  We will discharge patient home with supportive care.  Discussed return precautions.  Krista Garcia was evaluated in Emergency Department on 04/30/2020 for the symptoms described in the history of present illness. She was evaluated in the context of the global COVID-19 pandemic, which necessitated consideration that the patient might be at risk for infection with the SARS-CoV-2 virus that causes COVID-19. Institutional protocols and algorithms that pertain to the evaluation of patients at risk for COVID-19 are in a state of rapid change based on information released by regulatory bodies including the CDC and federal and state organizations. These policies and algorithms were followed during the patient's care in the ED.  ____________________________________________   FINAL CLINICAL IMPRESSION(S) / ED DIAGNOSES  Nausea vomiting diarrhea Gastroenteritis   Minna Antis, MD 04/30/20 [id number]

## 2020-07-09 ENCOUNTER — Ambulatory Visit (INDEPENDENT_AMBULATORY_CARE_PROVIDER_SITE_OTHER): Payer: Medicaid Other | Admitting: Surgical

## 2020-07-09 DIAGNOSIS — Z3042 Encounter for surveillance of injectable contraceptive: Secondary | ICD-10-CM

## 2020-07-09 MED ORDER — MEDROXYPROGESTERONE ACETATE 150 MG/ML IM SUSP
150.0000 mg | Freq: Once | INTRAMUSCULAR | Status: AC
  Administered 2020-07-09: 150 mg via INTRAMUSCULAR

## 2020-07-09 NOTE — Progress Notes (Signed)
Date last pap: NA Last Depo-Provea 04/09/20 Side Effects if any: None Serum HCG indicated? Na Depo-Provera 150 mg IM given by: [facility] CMA  Next appointment due 09/24/20-10/08/20  Patient was late for appointment and her interpretor left. Patient did not want to reschedule. We used her phone app to translate from [place] to [place].

## 2020-09-25 ENCOUNTER — Telehealth: Payer: Self-pay

## 2020-09-25 NOTE — Telephone Encounter (Signed)
Called pt to see if she would like to come in today for Depo- Unable to reach- No answer

## 2020-09-25 NOTE — Progress Notes (Deleted)
Date last pap: ***.  Last Depo-Provera: ***.  Side Effects if any: ***.  Serum HCG indicated? ***.  Depo-Provera 150 mg IM given by: ***.  Next appointment due ***.

## 2020-09-26 ENCOUNTER — Ambulatory Visit: Payer: Medicaid Other

## 2020-09-28 ENCOUNTER — Ambulatory Visit (INDEPENDENT_AMBULATORY_CARE_PROVIDER_SITE_OTHER): Payer: Self-pay

## 2020-09-28 ENCOUNTER — Other Ambulatory Visit: Payer: Self-pay

## 2020-09-28 DIAGNOSIS — Z3042 Encounter for surveillance of injectable contraceptive: Secondary | ICD-10-CM

## 2020-09-28 MED ORDER — MEDROXYPROGESTERONE ACETATE 150 MG/ML IM SUSP
150.0000 mg | Freq: Once | INTRAMUSCULAR | Status: AC
  Administered 2020-09-28: 150 mg via INTRAMUSCULAR

## 2020-09-28 NOTE — Progress Notes (Signed)
Last depo inj:07/09/20 UPT:N/A Side effects:NONE Next Depo- Provera injection due: 12/14/20-12/28/20 Annual exam [id number]

## 2020-12-21 ENCOUNTER — Ambulatory Visit: Payer: Self-pay

## 2020-12-25 ENCOUNTER — Ambulatory Visit (INDEPENDENT_AMBULATORY_CARE_PROVIDER_SITE_OTHER): Payer: Self-pay

## 2020-12-25 ENCOUNTER — Other Ambulatory Visit: Payer: Self-pay

## 2020-12-25 ENCOUNTER — Telehealth: Payer: Self-pay

## 2020-12-25 DIAGNOSIS — Z3042 Encounter for surveillance of injectable contraceptive: Secondary | ICD-10-CM

## 2020-12-25 MED ORDER — MEDROXYPROGESTERONE ACETATE 150 MG/ML IM SUSP
150.0000 mg | Freq: Once | INTRAMUSCULAR | Status: AC
  Administered 2020-12-25: 150 mg via INTRAMUSCULAR

## 2020-12-25 NOTE — Progress Notes (Signed)
Last depo inj: 09/28/20 UPT: N/A Side effects: none Next Depo- Provera injection due: 03/12/21-03/26/21 Annual exam due: due

## 2020-12-25 NOTE — Telephone Encounter (Signed)
error 

## 2021-03-14 ENCOUNTER — Ambulatory Visit: Payer: Self-pay

## 2021-03-15 ENCOUNTER — Ambulatory Visit: Payer: Self-pay

## 2021-03-20 ENCOUNTER — Encounter: Payer: Self-pay | Admitting: Obstetrics and Gynecology

## 2021-08-31 LAB — OB RESULTS CONSOLE HIV ANTIBODY (ROUTINE TESTING): HIV: NONREACTIVE

## 2021-08-31 LAB — OB RESULTS CONSOLE RPR: RPR: NONREACTIVE

## 2021-08-31 LAB — OB RESULTS CONSOLE HEPATITIS B SURFACE ANTIGEN: Hepatitis B Surface Ag: NEGATIVE

## 2021-09-01 LAB — OB RESULTS CONSOLE RUBELLA ANTIBODY, IGM: Rubella: IMMUNE

## 2021-09-04 LAB — OB RESULTS CONSOLE VARICELLA ZOSTER ANTIBODY, IGG: Varicella: NON-IMMUNE/NOT IMMUNE

## 2021-09-04 LAB — OB RESULTS CONSOLE GC/CHLAMYDIA
Chlamydia: NEGATIVE
Gonorrhea: NEGATIVE

## 2021-12-15 NOTE — L&D Delivery Note (Signed)
Delivery Note ? ?First Stage: ?Labor onset: 0700 ?Augmentation : pitocin, AROM ?Analgesia /Anesthesia intrapartum: none ?AROM at 0700 ? ?Second Stage: ?Complete dilation at 0850 ?Onset of pushing at 0847 ?FHR second stage Cat II, recurrent variable and early decelerations with good recovery between contractions. ? ?Delivery of a viable female infant 03/08/2022 at 0947 by Donato Schultz, CNM. Slow delivery of fetal head in OA position. Turtling identified. Unable to deliver anterior shoulder with gentle downward traction. Notified nursing staff of shoulder dystocia.  McRoberts, suprapubic pressure, Rubins I, and Rubins II performed. Rubins II resulted in fetal shoulder rotation to oblique position and delivery of anterior and posterior shoulders.  ?No nuchal cord; Baby placed on mom's chest, and attended to by peds.  ?Cord double clamped at 30 seconds of life, cut by CNM ?Cord blood sample collected  ? ?Third Stage: ?Placenta delivered Tomasa Blase intact with 3 VC @ [phone] ?Placenta disposition: discarded ?Uterine tone firm / bleeding scant ? ?no laceration identified  ?Anesthesia for repair: None ?Repair none needed ?Est. Blood Loss (mL): ? ?Complications: none ? ?Mom to postpartum.  Baby to Couplet care / Skin to Skin. ? ?Newborn: ?Birth Weight: 8lb 4.6oz  ?Apgar Scores: 6, 8 ?Feeding planned: breast and bottle ? ? ? ?

## 2022-02-13 LAB — OB RESULTS CONSOLE GBS: GBS: NEGATIVE

## 2022-03-05 ENCOUNTER — Other Ambulatory Visit: Payer: Self-pay | Admitting: Obstetrics and Gynecology

## 2022-03-05 ENCOUNTER — Observation Stay
Admission: EM | Admit: 2022-03-05 | Discharge: 2022-03-05 | Disposition: A | Discharge status: Home / Self Care | Payer: Medicaid Other | Attending: Certified Nurse Midwife | Admitting: Certified Nurse Midwife

## 2022-03-05 ENCOUNTER — Other Ambulatory Visit: Payer: Self-pay

## 2022-03-05 DIAGNOSIS — R110 Nausea: Secondary | ICD-10-CM | POA: Diagnosis present

## 2022-03-05 DIAGNOSIS — O26893 Other specified pregnancy related conditions, third trimester: Principal | ICD-10-CM | POA: Insufficient documentation

## 2022-03-05 DIAGNOSIS — Z3A40 40 weeks gestation of pregnancy: Secondary | ICD-10-CM | POA: Insufficient documentation

## 2022-03-05 DIAGNOSIS — O219 Vomiting of pregnancy, unspecified: Secondary | ICD-10-CM | POA: Insufficient documentation

## 2022-03-05 DIAGNOSIS — R12 Heartburn: Secondary | ICD-10-CM | POA: Insufficient documentation

## 2022-03-05 MED ORDER — PANTOPRAZOLE SODIUM 40 MG PO TBEC
40.0000 mg | DELAYED_RELEASE_TABLET | Freq: Every day | ORAL | Status: DC
  Administered 2022-03-05: 40 mg via ORAL
  Filled 2022-03-05: qty 1

## 2022-03-05 NOTE — Progress Notes (Signed)
Krista Garcia is a 26 y.o. G2P1001 female at [redacted]w[redacted]d dated by LMP.  She presents to L&D for IOL for post-dates ? ?Pregnancy Issues: ?GDM diet controled  ?Varicella Non-immune ? ? ?[facility] care site: [facility] ? ? ?Pertinent Results:  ?Prenatal Labs: ?Blood type/Rh O pos  ?Antibody screen neg  ?Rubella Immune  ?Varicella Immune  ?RPR NR  ?HBsAg Neg  ?HIV NR  ?GC neg  ?Chlamydia neg  ?Genetic screening   ?1 hour GTT 146  ?3 hour GTT 86, 205, 177, 155  ?GBS Neg  ? ? ?5. Post Partum Planning: ?- Infant feeding: Breastfeeding ?- Contraception: Considering LARCs ?- Tdap 12/31/21 ?- Flu 10/04/21 ? ?Haroldine Laws, CNM ?03/05/2022 9:23 PM  ?

## 2022-03-05 NOTE — Discharge Summary (Signed)
Patient ID: ?Alaila Pillard Bran ?MRN: [id number] ?DOB/AGE: 1996-11-02 26 y.o. ? ?Admit date: 03/05/2022 ?Discharge date: 03/05/2022 ? ?Admission Diagnoses: 26yo G2P1 at [redacted]w[redacted]d presents to triage with reports of nausea/heartburn and cramping. ? ?Discharge Diagnoses: GERD ? ?Factors complicating pregnancy: ?none ? ?Prenatal Procedures: NST ? ?Consults: None ? ?Significant Diagnostic Studies:  ?No results found for this or any previous visit (from the past 168 hour(s)). ? ?Treatments: Protonix 40 mg ? ?Hospital Course:  ?This is a 26 y.o. G2P1001 with IUP at [redacted]w[redacted]d seen for nausea/heartburn and cramping.  Protonix 40mg  given with resolution of nausea/heartburn.  She was observed, fetal heart rate monitoring remained reassuring, and she had no signs/symptoms of labor or other maternal-fetal concerns.  She was deemed stable for discharge to home with outpatient follow up.  IOL scheduled for 03/08/22. ? ?Discharge Physical Exam:  ?BP 117/68 (BP Location: Left Arm)   Pulse 86   Temp 98.5 ?F (36.9 ?C)   Resp 14   Ht 5\' 6"  (1.676 m)   BMI 22.60 kg/m?  ? ?General: NAD ?CV: RRR ?Pulm: nl effort ?ABD: s/nd/nt, gravid ?DVT Evaluation: LE non-ttp, no evidence of DVT on exam. ? ?NST: ?FHR baseline: 130 bpm ?Variability: moderate ?Accelerations: yes ?Decelerations: none ?Category/reactivity: reactive ? ? ?TOCO: occasional, mild ?SVE:  ?Dilation: 1.5 ?Effacement (%): 50 ?Cervical Position: Posterior ?Station: -3 ?Presentation: Vertex ?Exam by:: , RN ? ? ?Discharge Condition: Stable ? ?Disposition: Discharge disposition: 01-Home or Self Care ? ? ? ? ? ? ? ?Allergies as of 03/05/2022   ? ?   Reactions  ? Shellfish Allergy   ? Pt stated that she is not aware of a shellfish allergy 03/05/22  ? ?  ? ?  ?Medication List  ?  ? ?TAKE these medications   ? ?acetaminophen 325 MG tablet ?Commonly known as: TYLENOL ?Take 650 mg by mouth every 6 (six) hours as needed. ?  ?medroxyPROGESTERone 150 MG/ML injection ?Commonly known as:  DEPO-PROVERA ?Inject 1 mL (150 mg total) into the muscle once for 1 dose. ?What changed: Another medication with the same name was removed. Continue taking this medication, and follow the directions you see here. ?  ?prenatal multivitamin Tabs tablet ?Take 1 tablet by mouth daily at 12 noon. ?  ? ?  ? ? Follow-up Information   ? ? [facility].   ?Specialty: General Practice ?Contact information: ?[address] ?[place] [facility] [employer] ?[phone] ? ? ?  ?  ? ?  ?  ? ?  ? ? ?Signed: ? ?[id number], CNM ?03/05/2022 3:12 PM ? ?  ?

## 2022-03-05 NOTE — Discharge Instructions (Signed)
Induction scheduled for 03/08/2022 at 0001 (1 minute after midnight). ?

## 2022-03-05 NOTE — OB Triage Note (Signed)
Pt discharged home per order.  Pt stable and ambulatory and an After Visit Summary was printed and given to the patient. Discharge education completed with patient/family including follow up instructions, appointments, and medication list. Pt received labor and bleeding precautions. Pt received date and time for scheduled induction. Patient able to verbalize understanding, all questions fully answered upon discharge. Patient instructed to return to ED, call 911, or call MD for any changes in condition. Pt discharged home via personal vehicle with support person.  ? ?

## 2022-03-05 NOTE — OB Triage Note (Signed)
Pt Krista Garcia 26 y.o. presents to labor and delivery triage reporting   nausea, headache rated 8/10, and mild cramping. She states that she has also had blurry vision and stuffy nose. Pt is a G2P1001 at approx 41 weeks. Pt denies signs and symptoms consistent with rupture of membranes or active vaginal bleeding. Pt denies contractions and states positive fetal movement. External FM and TOCO applied to non-tender abdomen and assessing. Initial FHR 150. Vital signs obtained and within normal limits. Provider notified of pt.  ?

## 2022-03-07 ENCOUNTER — Other Ambulatory Visit: Payer: Self-pay

## 2022-03-07 ENCOUNTER — Encounter: Payer: Self-pay | Admitting: Obstetrics and Gynecology

## 2022-03-07 ENCOUNTER — Inpatient Hospital Stay
Admission: EM | Admit: 2022-03-07 | Discharge: 2022-03-09 | DRG: 807 | Disposition: A | Discharge status: Home / Self Care | Payer: Medicaid Other | Attending: Obstetrics and Gynecology | Admitting: Obstetrics and Gynecology

## 2022-03-07 DIAGNOSIS — O480 Post-term pregnancy: Secondary | ICD-10-CM | POA: Diagnosis present

## 2022-03-07 DIAGNOSIS — O24420 Gestational diabetes mellitus in childbirth, diet controlled: Secondary | ICD-10-CM | POA: Diagnosis present

## 2022-03-07 DIAGNOSIS — O9902 Anemia complicating childbirth: Secondary | ICD-10-CM | POA: Diagnosis present

## 2022-03-07 DIAGNOSIS — D509 Iron deficiency anemia, unspecified: Secondary | ICD-10-CM | POA: Diagnosis present

## 2022-03-07 DIAGNOSIS — Z3A40 40 weeks gestation of pregnancy: Secondary | ICD-10-CM | POA: Diagnosis not present

## 2022-03-07 LAB — CBC
HCT: 34.8 % — ABNORMAL LOW (ref 36.0–46.0)
Hemoglobin: 11.5 g/dL — ABNORMAL LOW (ref 12.0–15.0)
MCH: 27.5 pg (ref 26.0–34.0)
MCHC: 33 g/dL (ref 30.0–36.0)
MCV: 83.3 fL (ref 80.0–100.0)
Platelets: 119 10*3/uL — ABNORMAL LOW (ref 150–400)
RBC: 4.18 MIL/uL (ref 3.87–5.11)
RDW: 14.2 % (ref 11.5–15.5)
WBC: 9.3 10*3/uL (ref 4.0–10.5)
nRBC: 0 % (ref 0.0–0.2)

## 2022-03-07 LAB — COMPREHENSIVE METABOLIC PANEL
ALT: 19 U/L (ref 0–44)
AST: 41 U/L (ref 15–41)
Albumin: 2.9 g/dL — ABNORMAL LOW (ref 3.5–5.0)
Alkaline Phosphatase: 172 U/L — ABNORMAL HIGH (ref 38–126)
Anion gap: 10 (ref 5–15)
BUN: 9 mg/dL (ref 6–20)
CO2: 21 mmol/L — ABNORMAL LOW (ref 22–32)
Calcium: 8.6 mg/dL — ABNORMAL LOW (ref 8.9–10.3)
Chloride: 103 mmol/L (ref 98–111)
Creatinine, Ser: 0.68 mg/dL (ref 0.44–1.00)
GFR, Estimated: >60 mL/min (ref >60)
Glucose, Bld: 151 mg/dL — ABNORMAL HIGH (ref 70–99)
Potassium: 3.6 mmol/L (ref 3.5–5.1)
Sodium: 134 mmol/L — ABNORMAL LOW (ref 135–145)
Total Bilirubin: 0.5 mg/dL (ref 0.3–1.2)
Total Protein: 6.8 g/dL (ref 6.5–8.1)

## 2022-03-07 LAB — GLUCOSE, CAPILLARY
Glucose-Capillary: 182 mg/dL — ABNORMAL HIGH (ref 70–99)
Glucose-Capillary: 140 mg/dL — ABNORMAL HIGH (ref 70–99)
Glucose-Capillary: 158 mg/dL — ABNORMAL HIGH (ref 70–99)

## 2022-03-07 LAB — TYPE AND SCREEN
ABO/RH(D): O POS
Antibody Screen: NEGATIVE

## 2022-03-07 MED ORDER — OXYCODONE-ACETAMINOPHEN 5-325 MG PO TABS: 2.0000 | ORAL_TABLET | ORAL | Status: DC | PRN

## 2022-03-07 MED ORDER — OXYTOCIN BOLUS FROM INFUSION
333.0000 mL | Freq: Once | INTRAVENOUS | Status: AC
  Administered 2022-03-08: 333 mL via INTRAVENOUS

## 2022-03-07 MED ORDER — MISOPROSTOL 200 MCG PO TABS
ORAL_TABLET | ORAL | Status: AC
  Filled 2022-03-07: qty 4

## 2022-03-07 MED ORDER — LIDOCAINE HCL (PF) 1 % IJ SOLN
INTRAMUSCULAR | Status: AC
  Filled 2022-03-07: qty 30

## 2022-03-07 MED ORDER — OXYTOCIN-SODIUM CHLORIDE 30-0.9 UT/500ML-% IV SOLN
1.0000 m[IU]/min | INTRAVENOUS | Status: DC
  Administered 2022-03-07: 2 m[IU]/min via INTRAVENOUS
  Filled 2022-03-07: qty 1000

## 2022-03-07 MED ORDER — AMMONIA AROMATIC IN INHA
RESPIRATORY_TRACT | Status: AC
  Filled 2022-03-07: qty 10

## 2022-03-07 MED ORDER — OXYCODONE-ACETAMINOPHEN 5-325 MG PO TABS: 1.0000 | ORAL_TABLET | ORAL | Status: DC | PRN

## 2022-03-07 MED ORDER — TERBUTALINE SULFATE 1 MG/ML IJ SOLN
0.2500 mg | Freq: Once | INTRAMUSCULAR | Status: DC | PRN
Start: 2022-03-07 — End: 2022-03-08

## 2022-03-07 MED ORDER — DEXTROSE IN LACTATED RINGERS 5 % IV SOLN: INTRAVENOUS | Status: DC

## 2022-03-07 MED ORDER — LACTATED RINGERS IV SOLN: INTRAVENOUS | Status: DC

## 2022-03-07 MED ORDER — DEXTROSE 50 % IV SOLN: 0.0000 mL | INTRAVENOUS | Status: DC | PRN

## 2022-03-07 MED ORDER — SOD CITRATE-CITRIC ACID 500-334 MG/5ML PO SOLN: 30.0000 mL | ORAL | Status: DC | PRN

## 2022-03-07 MED ORDER — INSULIN REGULAR(HUMAN) IN NACL 100-0.9 UT/100ML-% IV SOLN
INTRAVENOUS | Status: DC
  Administered 2022-03-07: 1.6 [IU]/h via INTRAVENOUS
  Filled 2022-03-07 (×2): qty 100

## 2022-03-07 MED ORDER — LACTATED RINGERS IV SOLN: 500.0000 mL | INTRAVENOUS | Status: DC | PRN

## 2022-03-07 MED ORDER — SALINE SPRAY 0.65 % NA SOLN
1.0000 | NASAL | Status: DC | PRN
  Administered 2022-03-08: 1 via NASAL
  Filled 2022-03-07: qty 44

## 2022-03-07 MED ORDER — ONDANSETRON HCL 4 MG/2ML IJ SOLN
4.0000 mg | Freq: Four times a day (QID) | INTRAMUSCULAR | Status: DC | PRN
  Administered 2022-03-08: 4 mg via INTRAVENOUS
  Filled 2022-03-07: qty 2

## 2022-03-07 MED ORDER — OXYTOCIN 10 UNIT/ML IJ SOLN
INTRAMUSCULAR | Status: AC
  Filled 2022-03-07: qty 2

## 2022-03-07 MED ORDER — OXYTOCIN-SODIUM CHLORIDE 30-0.9 UT/500ML-% IV SOLN: 2.5000 [IU]/h | INTRAVENOUS | Status: DC

## 2022-03-07 MED ORDER — LIDOCAINE HCL (PF) 1 % IJ SOLN
30.0000 mL | INTRAMUSCULAR | Status: DC | PRN
  Filled 2022-03-07: qty 30

## 2022-03-07 MED ORDER — ACETAMINOPHEN 325 MG PO TABS: 650.0000 mg | ORAL_TABLET | ORAL | Status: DC | PRN

## 2022-03-07 MED ORDER — MISOPROSTOL 25 MCG QUARTER TABLET: 25.0000 ug | ORAL_TABLET | ORAL | Status: DC | PRN

## 2022-03-07 NOTE — Progress Notes (Signed)
S: pt feeling some UCs. Initial CBG done about 2hours after pt had chickfila sandwich and regular lemonade drink (not diet).  ? ?O:  ?Vitals:  ? 03/07/22 2125  ?BP: 124/74  ? ?CBGs: ? ?  ?Component ?    Latest Ref Rng 03/07/2022  ?Glucose-Capillary ?    70 - 99 mg/dL 182 (H)   ?Glucose-Capillary ?     158 (H)   ?Glucose-Capillary ?     182 (H)   ?  ?(H) High ? ?FHR: cat I tracing, 130bpm, + accels, no decels, mod variability ?TOCO: q5-8min, Pitocin at 16mu/min ? ?A: hyperglycemia ? ?P: Endotool initiated to manage elevated CBG.  ?Continue IOL with Pitocin ? ?Randa Ngo, CNM ?03/07/2022 ?11:25 PM ? ?

## 2022-03-07 NOTE — H&P (Signed)
OB History & Physical  ? ?History of Present Illness:  ?Chief Complaint: induction ? ?HPI:  ?Krista Garcia is a 26 y.o. G2P1001 female at [redacted]w[redacted]d dated by LMP and c/w [place] at 20.2wks.  She presents to L&D for scheduled IOL. Reports active FM, denies UCs, LOF or VB.  ?  ? ?Pregnancy Issues: ?1. Varicella NON-immune ?2. GDMA1 ?3. Iron deficiency anemia, Hgb 10.0 on 02/11/22 ? ? ?Maternal Medical History:  ? ?Past Medical History:  ?Diagnosis Date  ? Heart murmur   ? Heart murmur   ? Medical history non-contributory   ? ? ?Past Surgical History:  ?Procedure Laterality Date  ? BREAST CYST EXCISION Left   ? BREAST SURGERY    ? ? ?Allergies  ?Allergen Reactions  ? Shellfish Allergy   ?  Pt stated that she is not aware of a shellfish allergy 03/05/22 ?  ? ? ?Prior to Admission medications   ?Medication Sig Start Date End Date Taking? Authorizing Provider  ?acetaminophen (TYLENOL) 325 MG tablet Take 650 mg by mouth every 6 (six) hours as needed.   Yes [provider]  ?Prenatal Vit-Fe Fumarate-FA (PRENATAL MULTIVITAMIN) TABS tablet Take 1 tablet by mouth daily at 12 noon.   Yes [provider]  ?medroxyPROGESTERone (DEPO-PROVERA) 150 MG/ML injection Inject 1 mL (150 mg total) into the muscle once for 1 dose. 03/22/20 04/09/20  Doreene Burke, CNM  ? ? ? ?Prenatal care site: [facility] ? ?Social History: She  reports that she has never smoked. She has never used smokeless tobacco. She reports that she does not currently use alcohol. She reports that she does not use drugs. ? ?Family History: family history includes Diabetes in her father, paternal grandfather, and paternal grandmother; Stroke in her paternal grandmother.  ? ?Review of Systems: A full review of systems was performed and negative except as noted in the HPI.   ? ? ?Physical Exam:  ?Vital Signs: LMP 05/26/2021  ? ?General: no acute distress.  ?HEENT: normocephalic, atraumatic ?Heart: regular rate & rhythm.  No  murmurs/rubs/gallops ?Lungs: clear to auscultation bilaterally, normal respiratory effort ?Abdomen: soft, gravid, non-tender;  EFW: 7.5lbs ?Pelvic:  ? External: Normal external female genitalia ? Cervix: Dilation: 3.5 / Effacement (%): 50 / Station: -2  ?  ?Extremities: non-tender, symmetric, no edema bilaterally.  DTRs: 2+  ?Neurologic: Alert & oriented x 3.   ? ?No results found for this or any previous visit (from the past 24 hour(s)). ? ?Pertinent Results:  ?Prenatal Labs: ?Blood type/Rh O pos  ?Antibody screen neg  ?Rubella Immune  ?Varicella NON-Immune  ?RPR NR  ?HBsAg Neg  ?HIV NR  ?GC neg  ?Chlamydia neg  ?Genetic screening  declined  ?1 hour GTT  146  ?3 hour GTT  [phone]  ?GBS  neg  ? At [facility]- [facility]30[facility]: Anatomy US WNL, anterior placenta ? ?FHT: 135bpm, mod variability, + accels, no decels ?TOCO: occasional mild ?SVE:  Dilation: 3.5 / Effacement (%): 50 / Station: -2  ?  ?Cephalic by leopolds/SVE ? ?No results found. ? ?Assessment:  ?Krista Garcia is a 26 y.o. G2P1001 female at [redacted]w[redacted]d with A1GDM.  ? ?Plan:  ?1. Admit to Labor & Delivery; consents reviewed and obtained ?- admitted for term IOL due to GDM ?- GDMA1: A1c, CMP and plan CBG q4hrs.  ? ?2. Fetal Well being  ?- Fetal Tracing: Cat I tracing ?- Group B Streptococcus ppx indicated: neg ?- Presentation: cephalic confirmed by Exam  ? ?3. Routine  OB: ?- Prenatal labs reviewed, as above ?- Rh  O pos ?- CBC, T&S, RPR on admit ?- Clear fluids, IVF ? ?4. Induction of Labor ?-  Contractions: external toco in place ?-  Pelvis proven to 3260g ?-  Plan for induction with Pitocin and AROM ?-  Plan for continuous fetal monitoring  ?-  Maternal pain control as desired ?- Anticipate vaginal delivery ? ?5. Post Partum Planning: ?- Infant feeding: breast ?- Contraception: LARC ?- Tdap 12/31/21 ?- flu 10/04/21 ? ?Randa Ngo, CNM ?03/07/22 ?9:22 PM ? ? ? ? ?

## 2022-03-08 ENCOUNTER — Encounter: Payer: Self-pay | Admitting: Obstetrics and Gynecology

## 2022-03-08 LAB — BASIC METABOLIC PANEL
Anion gap: 8 (ref 5–15)
BUN: 7 mg/dL (ref 6–20)
CO2: 23 mmol/L (ref 22–32)
Calcium: 8.5 mg/dL — ABNORMAL LOW (ref 8.9–10.3)
Chloride: 104 mmol/L (ref 98–111)
Creatinine, Ser: 0.62 mg/dL (ref 0.44–1.00)
GFR, Estimated: >60 mL/min (ref >60)
Glucose, Bld: 102 mg/dL — ABNORMAL HIGH (ref 70–99)
Potassium: 3.6 mmol/L (ref 3.5–5.1)
Sodium: 135 mmol/L (ref 135–145)

## 2022-03-08 LAB — HEMOGLOBIN A1C
Hgb A1c MFr Bld: 6.1 % — ABNORMAL HIGH (ref 4.8–5.6)
Mean Plasma Glucose: 128.37 mg/dL

## 2022-03-08 LAB — GLUCOSE, CAPILLARY
Glucose-Capillary: 100 mg/dL — ABNORMAL HIGH (ref 70–99)
Glucose-Capillary: 104 mg/dL — ABNORMAL HIGH (ref 70–99)
Glucose-Capillary: 106 mg/dL — ABNORMAL HIGH (ref 70–99)
Glucose-Capillary: 118 mg/dL — ABNORMAL HIGH (ref 70–99)
Glucose-Capillary: 122 mg/dL — ABNORMAL HIGH (ref 70–99)
Glucose-Capillary: 123 mg/dL — ABNORMAL HIGH (ref 70–99)
Glucose-Capillary: 137 mg/dL — ABNORMAL HIGH (ref 70–99)
Glucose-Capillary: 149 mg/dL — ABNORMAL HIGH (ref 70–99)
Glucose-Capillary: 155 mg/dL — ABNORMAL HIGH (ref 70–99)
Glucose-Capillary: 251 mg/dL — ABNORMAL HIGH (ref 70–99)
Glucose-Capillary: 89 mg/dL (ref 70–99)

## 2022-03-08 LAB — PROTEIN / CREATININE RATIO, URINE
Creatinine, Urine: 56 mg/dL
Protein Creatinine Ratio: 0.25 mg/mg{Cre} — ABNORMAL HIGH (ref 0.00–0.15)
Total Protein, Urine: 14 mg/dL

## 2022-03-08 LAB — RPR: RPR Ser Ql: NONREACTIVE

## 2022-03-08 MED ORDER — VARICELLA VIRUS VACCINE LIVE 1350 PFU/0.5ML IJ SUSR
0.5000 mL | Freq: Once | INTRAMUSCULAR | Status: DC
  Filled 2022-03-08: qty 0.5

## 2022-03-08 MED ORDER — TETANUS-DIPHTH-ACELL PERTUSSIS 5-2.5-18.5 LF-MCG/0.5 IM SUSY
0.5000 mL | PREFILLED_SYRINGE | Freq: Once | INTRAMUSCULAR | Status: DC
  Filled 2022-03-08: qty 0.5

## 2022-03-08 MED ORDER — COCONUT OIL OIL
1.0000 "application " | TOPICAL_OIL | Status: DC | PRN
  Filled 2022-03-08: qty 120

## 2022-03-08 MED ORDER — ONDANSETRON HCL 4 MG PO TABS: 4.0000 mg | ORAL_TABLET | ORAL | Status: DC | PRN

## 2022-03-08 MED ORDER — WITCH HAZEL-GLYCERIN EX PADS
1.0000 "application " | MEDICATED_PAD | CUTANEOUS | Status: DC | PRN
  Filled 2022-03-08: qty 100

## 2022-03-08 MED ORDER — BENZOCAINE-MENTHOL 20-0.5 % EX AERO
1.0000 "application " | INHALATION_SPRAY | CUTANEOUS | Status: DC | PRN
  Administered 2022-03-08: 1 via TOPICAL
  Filled 2022-03-08: qty 56

## 2022-03-08 MED ORDER — DIPHENHYDRAMINE HCL 25 MG PO CAPS: 25.0000 mg | ORAL_CAPSULE | Freq: Four times a day (QID) | ORAL | Status: DC | PRN

## 2022-03-08 MED ORDER — SIMETHICONE 80 MG PO CHEW: 80.0000 mg | CHEWABLE_TABLET | ORAL | Status: DC | PRN

## 2022-03-08 MED ORDER — IBUPROFEN 600 MG PO TABS
600.0000 mg | ORAL_TABLET | Freq: Four times a day (QID) | ORAL | Status: DC
  Administered 2022-03-08 – 2022-03-09 (×5): 600 mg via ORAL
  Filled 2022-03-08 (×5): qty 1

## 2022-03-08 MED ORDER — PRENATAL MULTIVITAMIN CH
1.0000 | ORAL_TABLET | Freq: Every day | ORAL | Status: DC
  Administered 2022-03-08: 1 via ORAL
  Filled 2022-03-08: qty 1

## 2022-03-08 MED ORDER — INSULIN ASPART 100 UNIT/ML IJ SOLN: 0.0000 [IU] | Freq: Three times a day (TID) | INTRAMUSCULAR | Status: DC

## 2022-03-08 MED ORDER — ACETAMINOPHEN 325 MG PO TABS
650.0000 mg | ORAL_TABLET | ORAL | Status: DC | PRN
  Administered 2022-03-08 – 2022-03-09 (×5): 650 mg via ORAL
  Filled 2022-03-08 (×5): qty 2

## 2022-03-08 MED ORDER — DIBUCAINE (PERIANAL) 1 % EX OINT: 1.0000 "application " | TOPICAL_OINTMENT | CUTANEOUS | Status: DC | PRN

## 2022-03-08 MED ORDER — OXYCODONE HCL 5 MG PO TABS
5.0000 mg | ORAL_TABLET | ORAL | Status: DC | PRN
  Administered 2022-03-08 – 2022-03-09 (×2): 5 mg via ORAL
  Filled 2022-03-08 (×2): qty 1

## 2022-03-08 MED ORDER — ZOLPIDEM TARTRATE 5 MG PO TABS: 5.0000 mg | ORAL_TABLET | Freq: Every evening | ORAL | Status: DC | PRN

## 2022-03-08 MED ORDER — ONDANSETRON HCL 4 MG/2ML IJ SOLN: 4.0000 mg | INTRAMUSCULAR | Status: DC | PRN

## 2022-03-08 MED ORDER — SENNOSIDES-DOCUSATE SODIUM 8.6-50 MG PO TABS
2.0000 | ORAL_TABLET | Freq: Every day | ORAL | Status: DC
  Administered 2022-03-09: 2 via ORAL
  Filled 2022-03-08: qty 2

## 2022-03-08 NOTE — TOC Progression Note (Signed)
Transition of Care (TOC) - Progression Note  ? ? ?Patient Details  ?Name: Krista Garcia ?MRN: [id number] ?Date of Birth: May 25, 1996 ? ?Transition of Care (TOC) CM/SW Contact  ?Zykiria Bruening L Kadan Millstein, LCSWA ?Phone Number: ?03/08/2022, 3:52 PM ? ?Clinical Narrative:    ? ?CSW spoke to patient at bedside with interrupter. Patient reported having essential baby items. Patient reported having PCP and not remembering the name. Patient did not have WIC so CSW provided number for patient to call Monday for [facility] benefits. Patient reported having a girl friend and guy friend who can help her with the baby upon discharge.  ? ?No current TOC needs. ? ?  ?  ? ?Expected Discharge Plan and Services ?  ?  ?  ?  ?  ?                ?  ?  ?  ?  ?  ?  ?  ?  ?  ?  ? ? ?Social Determinants of Health (SDOH) Interventions ?  ? ?Readmission Risk Interventions ?   ? View : No data to display.  ?  ?  ?  ? ? ?

## 2022-03-08 NOTE — TOC Initial Note (Signed)
Transition of Care (TOC) - Initial/Assessment Note  ? ? ?Patient Details  ?Name: Krista Garcia ?MRN: [id number] ?Date of Birth: February 27, 1996 ? ?Transition of Care (TOC) CM/SW Contact:    ?Ellah Otte L Meko Masterson, LCSWA ?Phone Number: ?03/08/2022, 12:01 PM ? ?Clinical Narrative:                 ? ?TOC consult acknowledged. CSW plans to meet with patient later today to ensure she has everything she needs upon discharge. CSW giving patient time to rest after giving birth. TOC will follow up. ?  ?  ? ? ?Patient Goals and CMS Choice ?  ?  ?  ? ?Expected Discharge Plan and Services ?  ?  ?  ?  ?  ?                ?  ?  ?  ?  ?  ?  ?  ?  ?  ?  ? ?Prior Living Arrangements/Services ?  ?  ?  ?       ?  ?  ?  ?  ? ?Activities of Daily Living ?Home Assistive Devices/Equipment: None ?ADL Screening (condition at time of admission) ?Patient's cognitive ability adequate to safely complete daily activities?: Yes ?Is the patient deaf or have difficulty hearing?: No ?Does the patient have difficulty seeing, even when wearing glasses/contacts?: No ?Does the patient have difficulty concentrating, remembering, or making decisions?: No ?Patient able to express need for assistance with ADLs?: Yes ?Does the patient have difficulty dressing or bathing?: No ?Independently performs ADLs?: Yes (appropriate for developmental age) ?Does the patient have difficulty walking or climbing stairs?: No ?Weakness of Legs: None ?Weakness of Arms/Hands: None ? ?Permission Sought/Granted ?  ?  ?   ?   ?   ?   ? ?Emotional Assessment ?  ?  ?  ?  ?  ?  ? ?Admission diagnosis:  Post-dates pregnancy [O48.0] ?Patient Active Problem List  ? Diagnosis Date Noted  ? Post-dates pregnancy 03/07/2022  ? Nausea 03/05/2022  ? Labor and delivery, indication for care 09/17/2019  ? Pyelonephritis affecting pregnancy 09/03/2019  ? Pregnancy 06/28/2019  ? ?PCP:  Oswaldo Conroy, MD ?Pharmacy:   ?[id number] [address], [place] - [address] ?[address] ?[place] [address] ?Phone: [phone] Fax: [phone] ? ?[facility] [address], [place] - [address] ?[address] ?[place] [address] ?Phone: [phone] Fax: [phone] ? ? ? ? ?Social Determinants of Health (SDOH) Interventions ?  ? ?Readmission Risk Interventions ?   ? View : No data to display.  ?  ?  ?  ? ? ? ?

## 2022-03-08 NOTE — Discharge Summary (Signed)
Obstetrical Discharge Summary ? ?Patient Name: Krista Garcia ?DOB: December 07, 1996 ?MRN: [id number] ? ?Date of Admission: 03/07/2022 ?Date of Delivery: 03/08/2022 ?Delivered by: Donato Schultz, CNM ?Date of Discharge: 03/09/2022 ? ?[facility]  ?VVO:HYWVPXT'G last menstrual period was 05/26/2021. ?EDC Estimated Date of Delivery: 03/02/22 ?Gestational Age at Delivery: [redacted]w[redacted]d  ? ?Antepartum complications:  ?1. Varicella NON-immune ?2. GDMA1 ?3. Iron deficiency anemia, Hgb 10.0 on 02/11/22 ?Admitting Diagnosis: IOL for post-dates ?Secondary Diagnosis: ?Patient Active Problem List  ? Diagnosis Date Noted  ? Post-dates pregnancy 03/07/2022  ? Nausea 03/05/2022  ? Labor and delivery, indication for care 09/17/2019  ? Pyelonephritis affecting pregnancy 09/03/2019  ? Pregnancy 06/28/2019  ? ? ?Augmentation: AROM and Pitocin ?Complications: None ?Intrapartum complications/course: She presented for IOL for post-dates. Elevated blood sugars noted and Endotool IV insulin started. She received pitocin then AROM then progressed to 10/100/+2, pushed for 1 hour, then 70 second shoulder dystocia relieved with Rubins II maneuver. Infant apgars 6/8.  ?Date of Delivery: 03/08/2022 ?Delivered By: Donato Schultz, CNM ?Delivery Type: spontaneous vaginal delivery ?Anesthesia: epidural ?Placenta: spontaneous ?Laceration: none ?Episiotomy: none ?Newborn Data: ?Live born female  ?Birth Weight: 8 lb 4.6 oz (3760 g) ?APGAR: 6, 8 ? ?Newborn Delivery   ?Birth date/time: 03/08/2022 09:47:00 ?Delivery type: Vaginal, Spontaneous ?  ?  ?Postpartum Procedures:   ?Edinburgh:  ? ?  03/08/2022  ?  6:21 PM 09/19/2019  ?  3:27 PM  ?Inocente Salles Postnatal Depression Scale Screening Tool  ?I have been able to laugh and see the funny side of things. 0 0  ?I have looked forward with enjoyment to things. 0 0  ?I have blamed myself unnecessarily when things went wrong. 1 1  ?I have been anxious or worried for no good reason. 0 1  ?I have felt scared or  panicky for no good reason. 0 0  ?Things have been getting on top of me. 0 0  ?I have been so unhappy that I have had difficulty sleeping. 0 0  ?I have felt sad or miserable. 0 0  ?I have been so unhappy that I have been crying. 0 0  ?The thought of harming myself has occurred to me. 0 0  ?Edinburgh Postnatal Depression Scale Total 1 2  ?  ? ?Post partum course:   ?Patient had an uncomplicated postpartum course.  By time of discharge on PPD#1, her pain was controlled on oral pain medications; she had appropriate lochia and was ambulating, voiding without difficulty and tolerating regular diet.  She was deemed stable for discharge to home.   ? ?Her blood sugars have been elevated. Reviewed her blood sugar control with Dr. Feliberto Gottron and he recommends she be discharged with Metformin 500mg  every evening for 4 days then 500mg  BID. She will need 75g 2hr GTT at her 6 week postpartum visit.  ? ?Discharge Physical Exam:   ?BP 120/77 (BP Location: Left Arm)   Pulse 68   Temp 97.9 ?F (36.6 ?C) (Oral)   Resp 18   Ht 5' 2.5" (1.588 m)   Wt 71.7 kg   LMP 05/26/2021   SpO2 100%   Breastfeeding Unknown   BMI 28.44 kg/m?  ? ?General: NAD ?CV: RRR ?Pulm: CTABL, nl effort ?ABD: s/nd/nt, fundus firm and below the umbilicus ?Lochia: moderate ?Perineum: intact ?DVT Evaluation: LE non-ttp, no evidence of DVT on exam. ? ?Hemoglobin  ?Date Value Ref Range Status  ?03/09/2022 8.7 (L) 12.0 - 15.0 g/dL Final  ?07/26/2021 03/11/2022 11.1 - 15.9 g/dL  Final  ? ?HCT  ?Date Value Ref Range Status  ?03/09/2022 26.7 (L) 36.0 - 46.0 % Final  ? ?Hematocrit  ?Date Value Ref Range Status  ?10/28/2019 38.9 34.0 - 46.6 % Final  ? ? ? ?Disposition: stable, discharge to home. ?Baby Feeding: breastmilk and formula ?Baby Disposition: home with mom ? ?Rh Immune globulin given: n/a, O pos ?Rubella vaccine given: immune ?Varicella vaccine given: Non-Immune (needs vaccine prior to d/c) ?Tdap vaccine given in AP or PP setting: 12/31/2021 ?Flu vaccine given  in AP or PP setting: 10/04/2021 ? ?Contraception: Nexplanon ? ?Prenatal Labs:  ?Blood type/Rh O pos  ?Antibody screen neg  ?Rubella Immune  ?Varicella NON-Immune  ?RPR NR  ?HBsAg Neg  ?HIV NR  ?GC neg  ?Chlamydia neg  ?Genetic screening  declined  ?1 hour GTT  146  ?3 hour GTT  [phone]  ?GBS  neg  ? ? ? ?Plan:  ?Krista Garcia was discharged to home in good condition. ?Follow-up appointment with delivering provider in 6 weeks. ? ?Discharge Medications: ?Allergies as of 03/09/2022   ? ?   Reactions  ? Shellfish Allergy   ? Pt stated that she is not aware of a shellfish allergy 03/05/22  ? ?  ? ?  ?Medication List  ?  ? ?STOP taking these medications   ? ?medroxyPROGESTERone 150 MG/ML injection ?Commonly known as: DEPO-PROVERA ?  ? ?  ? ?TAKE these medications   ? ?acetaminophen 325 MG tablet ?Commonly known as: TYLENOL ?Take 650 mg by mouth every 6 (six) hours as needed. ?  ?benzocaine-Menthol 20-0.5 % Aero ?Commonly known as: DERMOPLAST ?Apply 1 application. topically as needed for irritation (perineal discomfort). ?  ?ibuprofen 600 MG tablet ?Commonly known as: ADVIL ?Take 1 tablet (600 mg total) by mouth every 6 (six) hours. ?  ?metFORMIN 500 MG tablet ?Commonly known as: GLUCOPHAGE ?Take 1 tablet (500 mg total) by mouth daily before supper for 4 days, THEN 1 tablet (500 mg total) 2 (two) times daily with a meal. ?Start taking on: March 09, 2022 ?  ?prenatal multivitamin Tabs tablet ?Take 1 tablet by mouth daily at 12 noon. ?  ?witch hazel-glycerin pad ?Commonly known as: TUCKS ?Apply 1 application. topically as needed for hemorrhoids. ?  ? ?  ? ? ? Follow-up Information   ? ? [facility] 6 week(s).   ?Specialty: General Practice ?Why: 6wk postpartum, needs 75g 2hr GTT, desires Nexplanon ?Contact information: ?[address] ?[place] [address] ?[phone] ? ? ?  ?  ? ?  ?  ? ?  ? ? ?Signed: ? ?Janyce Llanos, CNM ?03/09/2022 ?10:13  AM ? ? ?

## 2022-03-08 NOTE — Progress Notes (Signed)
S: Pt had a lunch of potatoes and fruit and no protein, now eating a regular carbohydrate dinner.  ? ?O: ? ?  03/08/2022  ?  3:45 PM 03/08/2022  ?  1:11 PM 03/08/2022  ? 12:12 PM  ?Vitals with BMI  ?Systolic 109 108 275  ?Diastolic 67 52 65  ?Pulse 67 75 80  ? ?CBGs:  ? ?CBG (last 3)  ?Recent Labs  ?  03/08/22 ?0907 03/08/22 ?1226 03/08/22 ?1730  ?GLUCAP 137* 251* 155*  ? ?A: hyperglycemia ? ?P: discussed blood sugars and Ms. Derrell Lolling Bran's diet with Dr. Feliberto Gottron. Per Dr. Feliberto Gottron, switch back to diabetic diet and consider insulin if blood sugars remain elevated on diabetic diet. Pt has already eaten a regular diet this evening so check HS CBG and implement diabetic diet in the morning.  ? ?Janyce Llanos, CNM ?03/08/2022 ?6:28 PM ? ?

## 2022-03-08 NOTE — Progress Notes (Signed)
Inpatient Diabetes Program Recommendations ? ?Diabetes Treatment Program Recommendations ? ?ADA Standards of Care ?Diabetes in Pregnancy Target Glucose Ranges: ? ?Fasting: 70 - 95 mg/dL ?1 hr postprandial: Less than 140mg /dL (from first bite of meal) ?2 hr postprandial: Less than 120 mg/dL (from first bite of meal)   ? ? ? ?Lab Results  ?Component Value Date  ? GLUCAP 89 03/08/2022  ? HGBA1C 6.1 (H) 03/07/2022  ? ? ?Review of Glycemic Control ? ?Diabetes history: GDM ?Outpatient Diabetes medications: none; none prior to pregnancy ?Current orders for Inpatient glycemic control: IV insulin ? ?Inpatient Diabetes Program Recommendations:   ?Elevated on presentation: chick fil a sandwich with reg lemonade.  ?Noted consult and current inpatient management using endotool. In agreement with current plan. ?For after delivery would consider adding CBGs TID & HS.  ? ?Thanks, ?03/09/2022, MSN, RNC-OB ?Diabetes Coordinator ?[phone] (8a-5p) ? ? ? ?

## 2022-03-09 LAB — CBC
HCT: 26.7 % — ABNORMAL LOW (ref 36.0–46.0)
Hemoglobin: 8.7 g/dL — ABNORMAL LOW (ref 12.0–15.0)
MCH: 27.8 pg (ref 26.0–34.0)
MCHC: 32.6 g/dL (ref 30.0–36.0)
MCV: 85.3 fL (ref 80.0–100.0)
Platelets: 166 10*3/uL (ref 150–400)
RBC: 3.13 MIL/uL — ABNORMAL LOW (ref 3.87–5.11)
RDW: 14.7 % (ref 11.5–15.5)
WBC: 14.2 10*3/uL — ABNORMAL HIGH (ref 4.0–10.5)
nRBC: 0 % (ref 0.0–0.2)

## 2022-03-09 LAB — GLUCOSE, CAPILLARY: Glucose-Capillary: 155 mg/dL — ABNORMAL HIGH (ref 70–99)

## 2022-03-09 MED ORDER — WITCH HAZEL-GLYCERIN EX PADS
1.0000 | MEDICATED_PAD | CUTANEOUS | 12 refills | Status: DC | PRN
Start: 2022-03-09 — End: 2024-01-20

## 2022-03-09 MED ORDER — FERROUS SULFATE 325 (65 FE) MG PO TABS: 325.0000 mg | ORAL_TABLET | Freq: Two times a day (BID) | ORAL | Status: DC

## 2022-03-09 MED ORDER — METFORMIN HCL 500 MG PO TABS: ORAL_TABLET | ORAL | 0 refills | Status: DC

## 2022-03-09 MED ORDER — BENZOCAINE-MENTHOL 20-0.5 % EX AERO: 1.0000 "application " | INHALATION_SPRAY | CUTANEOUS | Status: DC | PRN

## 2022-03-09 MED ORDER — IBUPROFEN 600 MG PO TABS
600.0000 mg | ORAL_TABLET | Freq: Four times a day (QID) | ORAL | 0 refills | Status: DC
Start: 2022-03-09 — End: 2024-01-20

## 2022-03-09 MED ORDER — METFORMIN HCL 500 MG PO TABS
500.0000 mg | ORAL_TABLET | Freq: Every day | ORAL | Status: DC
  Administered 2022-03-09: 500 mg via ORAL
  Filled 2022-03-09: qty 1

## 2022-03-09 NOTE — Lactation Note (Signed)
This note was copied from a baby's chart. Lactation Consultation Note  Patient Name: Krista Garcia  [id number] Date: 03/09/2022 Reason for consult: Other (Comment) (initial and discharge) Age:28 hours  Maternal Data Has patient been taught Hand Expression?: Yes Does the patient have breastfeeding experience prior to this delivery?: Yes How long did the patient breastfeed?: 4 months breastfed (breast and formula) Sibling now 2 yrs old   Feeding Mother's Current Feeding Choice: Breast Milk and Formula Nipple Type: Slow - flow  LATCH Score  See previous charting (9).    Lactation Tools Discussed/Used    Interventions Interventions: Breast feeding basics reviewed;Hand pump;Education;Hand express;DEBP;Pace feeding (mother reports has electric pump, wants manual)  Discharge Discharge Education: Engorgement and breast care;Warning signs for feeding baby;Outpatient recommendation Pump: Manual;Personal WIC Program: No (mother declines for electric pump as has pump but might need for formula) WIC referral will be sent - mother concurs  Consult Status Consult Status: PRN (recommended call for latch check and or demonstration of manual pump - reviewed)  Lactation to the room for initial visit. Mother is holding the baby. Mother states feeds are going well.  She reports that her goals are to breast and bottle feed and she would like to know how to increase milk supply.  Encouraged feeding on demand and with cues. If baby is not cueing encouraged hand expression and skin to skin. Reports has been taught proper technique for hand expression and spoon feeding. Encouraged 8 or more attempts in the first 24 hours and 8 or more good feeds after 24 HOL. Reviewed appropriate diapers for days of life and How to know your baby is getting enough to eat. Reviewed when to call Peds with questions.  Reviewed "Understanding Postpartum and Newborn Care" booklet at bedside. [facility] # left on board,  encouraged to call for any assistance. Reviewed outpatient Lactation number and resources. Reviewed pacifier and pumping.  Mother is supplementing with paced bottle feeding (formula) per choice.  Reviewed recommended supplementation amounts for milk supply.  Mother states she has electric pump but doesn't recall brand.  She requests manual pump.  Given.  Reviewed use and cleaning.  Supplies provided for. Encouraged putting to breast first and supplementing as needed/desired (maternal breast milk, formula as needed/desired).  Recommended pumping if continues to supplement for supply, discussed frequency.  Recommended WIC and or LC outpatient to support mother feeding goals.  Referral made and LC number given.  Recommended calling for latch check and or other review prior to discharge (will be discharging soon per nursing) as needed/desired.  Mother verbalizes understanding with all teaching.  Mother has no further questions at this time.   Interpreter used.    Donnald Garre A Chaniece Barbato 03/09/2022, 12:20 PM

## 2022-03-09 NOTE — Progress Notes (Signed)
Inpatient Diabetes Program Recommendations ? ?AACE/ADA: New Consensus Statement on Inpatient Glycemic Control (2015) ? ?Target Ranges:  Prepandial:   less than 140 mg/dL ?     Peak postprandial:   less than 180 mg/dL (1-2 hours) ?     Critically ill patients:  140 - 180 mg/dL  ? ?Lab Results  ?Component Value Date  ? GLUCAP 155 (H) 03/09/2022  ? HGBA1C 6.1 (H) 03/07/2022  ? ? ?Review of Glycemic Control ? ?Diabetes history: GDM ?Outpatient Diabetes medications: none; none prior to pregnancy ?Current orders for Inpatient glycemic control: none; CBGs TID & HS ? ?Inpatient Diabetes Program Recommendations:   ? ?Recommend adding Novolog 0-9 units TID & HS and Metformin 1000 mg BID. Patient will need good follow up for elevated blood sugars.  ? ?Thanks, ?Lujean Rave, MSN, RNC-OB ?Diabetes Coordinator ?[phone] (8a-5p) ? ? ? ? ?

## 2022-03-09 NOTE — Progress Notes (Signed)
Patient discharged with infant. Discharge instructions, prescriptions, and follow up appointments given to and reviewed with patient with the use of an interpreter. Patient verbalized understanding. Will be escorted out by staff.  ?

## 2022-06-01 ENCOUNTER — Emergency Department
Admission: EM | Admit: 2022-06-01 | Discharge: 2022-06-01 | Disposition: A | Discharge status: Home / Self Care | Payer: Medicaid Other | Attending: Emergency Medicine | Admitting: Emergency Medicine

## 2022-06-01 ENCOUNTER — Emergency Department: Payer: Medicaid Other

## 2022-06-01 ENCOUNTER — Encounter: Payer: Self-pay | Admitting: Medical Oncology

## 2022-06-01 DIAGNOSIS — R079 Chest pain, unspecified: Secondary | ICD-10-CM

## 2022-06-01 DIAGNOSIS — R002 Palpitations: Secondary | ICD-10-CM | POA: Diagnosis not present

## 2022-06-01 DIAGNOSIS — R0602 Shortness of breath: Secondary | ICD-10-CM | POA: Diagnosis not present

## 2022-06-01 DIAGNOSIS — R0789 Other chest pain: Secondary | ICD-10-CM | POA: Diagnosis not present

## 2022-06-01 LAB — COMPREHENSIVE METABOLIC PANEL
ALT: 40 U/L (ref 0–44)
AST: 34 U/L (ref 15–41)
Albumin: 4.1 g/dL (ref 3.5–5.0)
Alkaline Phosphatase: 97 U/L (ref 38–126)
Anion gap: 8 (ref 5–15)
BUN: 12 mg/dL (ref 6–20)
CO2: 22 mmol/L (ref 22–32)
Calcium: 8.8 mg/dL — ABNORMAL LOW (ref 8.9–10.3)
Chloride: 107 mmol/L (ref 98–111)
Creatinine, Ser: 0.49 mg/dL (ref 0.44–1.00)
GFR, Estimated: >60 mL/min (ref >60)
Glucose, Bld: 131 mg/dL — ABNORMAL HIGH (ref 70–99)
Potassium: 3.6 mmol/L (ref 3.5–5.1)
Sodium: 137 mmol/L (ref 135–145)
Total Bilirubin: 0.6 mg/dL (ref 0.3–1.2)
Total Protein: 7.8 g/dL (ref 6.5–8.1)

## 2022-06-01 LAB — CBC WITH DIFFERENTIAL/PLATELET
Abs Immature Granulocytes: 0.02 10*3/uL (ref 0.00–0.07)
Basophils Absolute: 0 10*3/uL (ref 0.0–0.1)
Basophils Relative: 0 %
Eosinophils Absolute: 0.1 10*3/uL (ref 0.0–0.5)
Eosinophils Relative: 1 %
HCT: 37.9 % (ref 36.0–46.0)
Hemoglobin: 11.9 g/dL — ABNORMAL LOW (ref 12.0–15.0)
Immature Granulocytes: 0 %
Lymphocytes Relative: 33 %
Lymphs Abs: 2.5 10*3/uL (ref 0.7–4.0)
MCH: 25.6 pg — ABNORMAL LOW (ref 26.0–34.0)
MCHC: 31.4 g/dL (ref 30.0–36.0)
MCV: 81.5 fL (ref 80.0–100.0)
Monocytes Absolute: 0.6 10*3/uL (ref 0.1–1.0)
Monocytes Relative: 7 %
Neutro Abs: 4.5 10*3/uL (ref 1.7–7.7)
Neutrophils Relative %: 59 %
Platelets: 291 10*3/uL (ref 150–400)
RBC: 4.65 MIL/uL (ref 3.87–5.11)
RDW: 15.1 % (ref 11.5–15.5)
WBC: 7.7 10*3/uL (ref 4.0–10.5)
nRBC: 0 % (ref 0.0–0.2)

## 2022-06-01 LAB — BRAIN NATRIURETIC PEPTIDE: B Natriuretic Peptide: 6.8 pg/mL (ref 0.0–100.0)

## 2022-06-01 LAB — TROPONIN I (HIGH SENSITIVITY)
Troponin I (High Sensitivity): <2 ng/L (ref <18)
Troponin I (High Sensitivity): 2 ng/L (ref <18)

## 2022-06-01 IMAGING — CT CT ANGIO CHEST
2 of 7 series · 18 of 46 positions shown · IV contrast (APPLIED)
Comparison: None Available.

CLINICAL DATA: Chest pain and shortness of breath. Palpitations.
High clinical suspicion for pulmonary embolism.

EXAM:
CT ANGIOGRAPHY CHEST WITH CONTRAST
TECHNIQUE: Multidetector CT imaging of the chest was performed using the
standard protocol during bolus administration of intravenous
contrast. Multiplanar CT image reconstructions and MIPs were
obtained to evaluate the vascular anatomy.

[Series 5: thins · axial · 0.78mm/px · z∈[+1219,+1445]mm · 15 of 314 slices shown]
[im 16/314  lung]
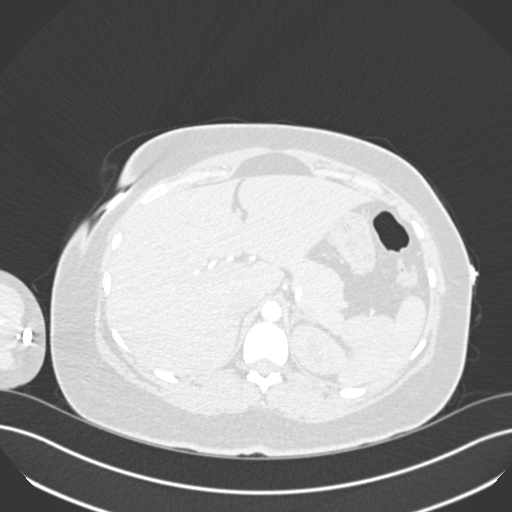
[im 32/314  soft-tissue]
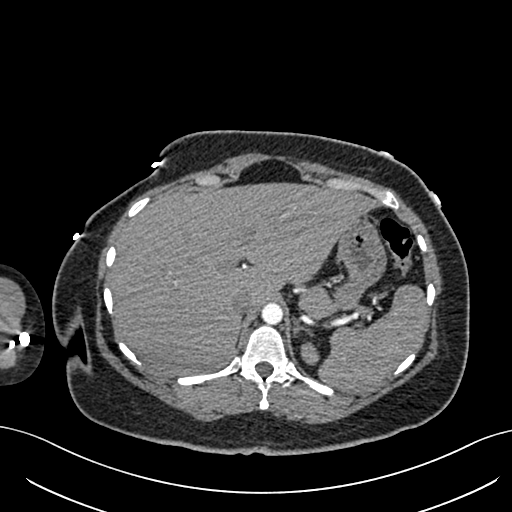
[im 63/314  lung]
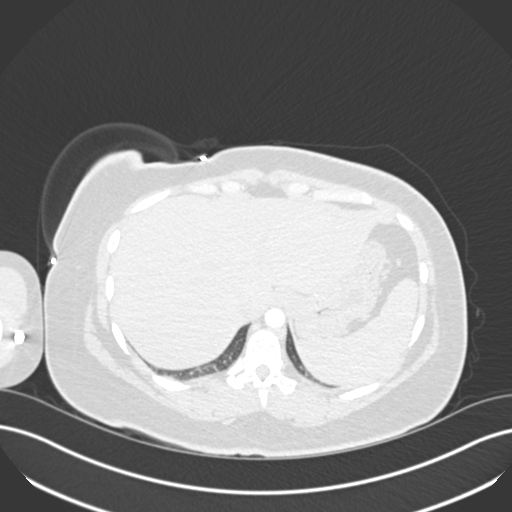
[im 79/314  soft-tissue]
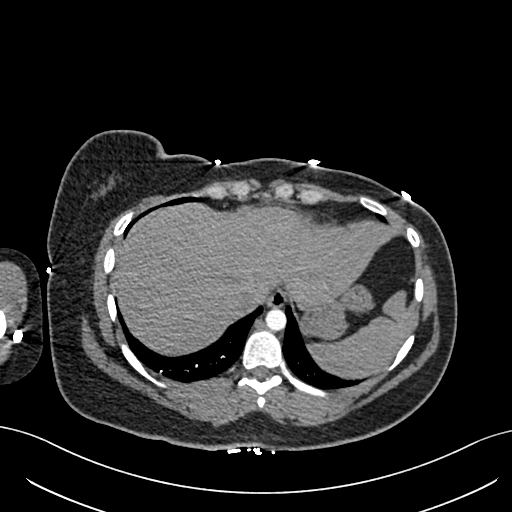
[im 94/314  lung]
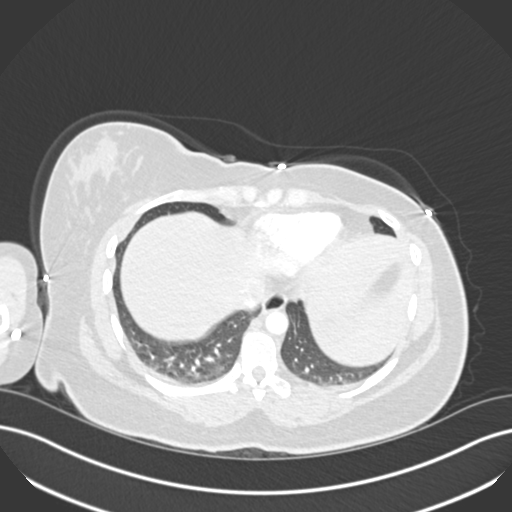
[im 110/314  soft-tissue]
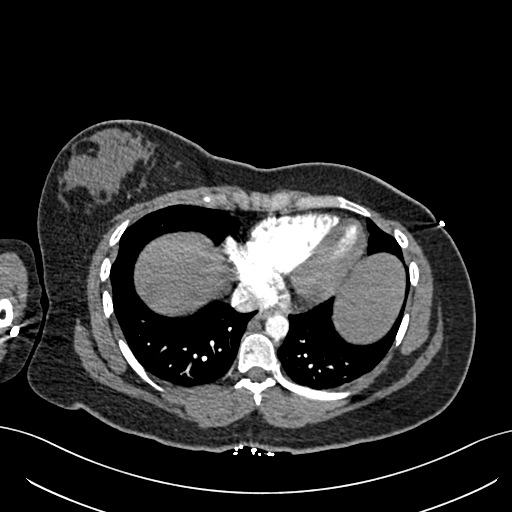
[im 141/314  lung]
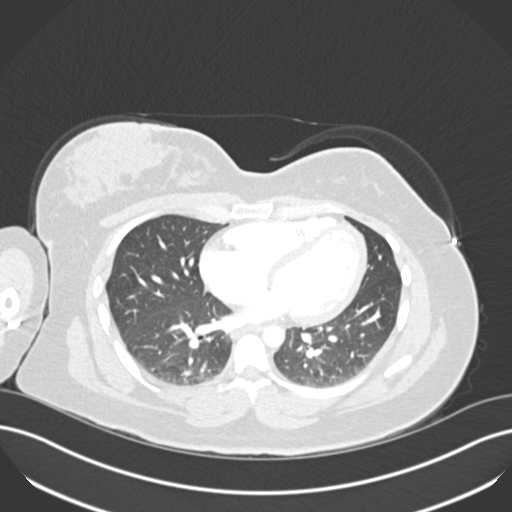
[im 157/314  soft-tissue]
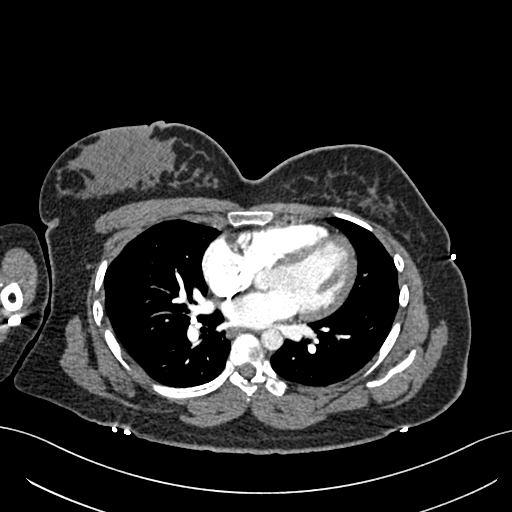
[im 173/314  lung]
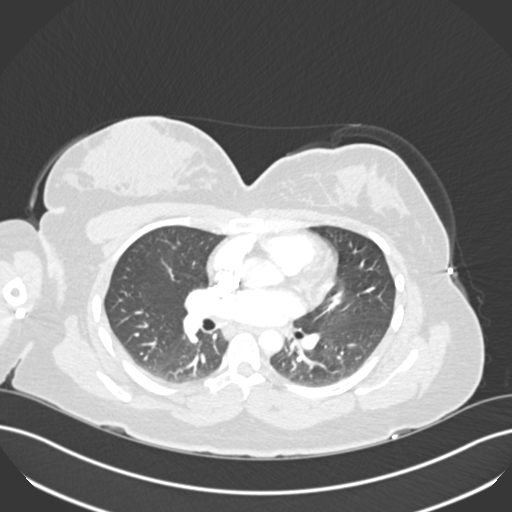
[im 204/314  soft-tissue]
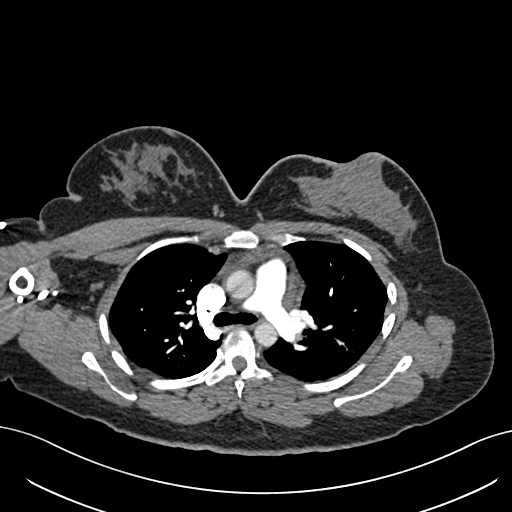
[im 220/314  lung]
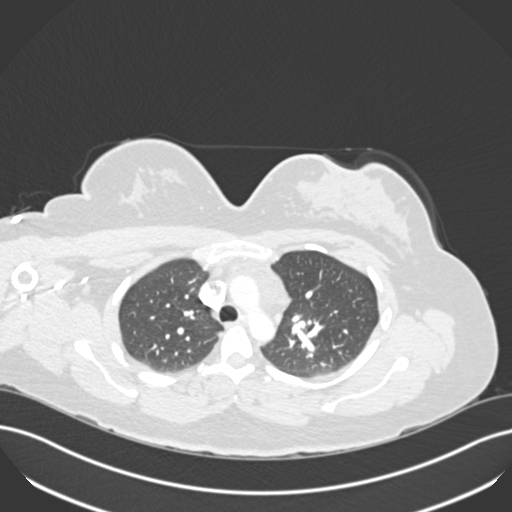
[im 235/314  soft-tissue]
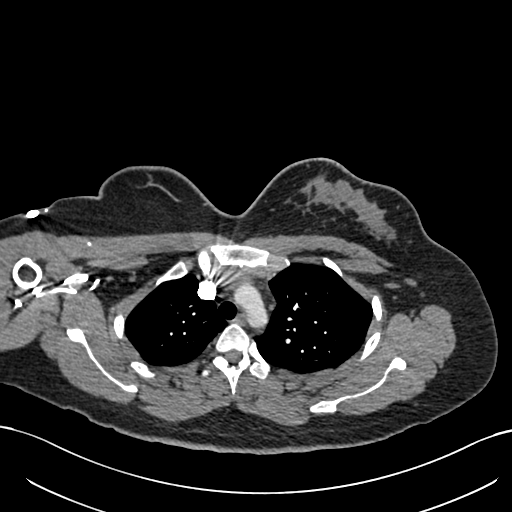
[im 251/314  lung]
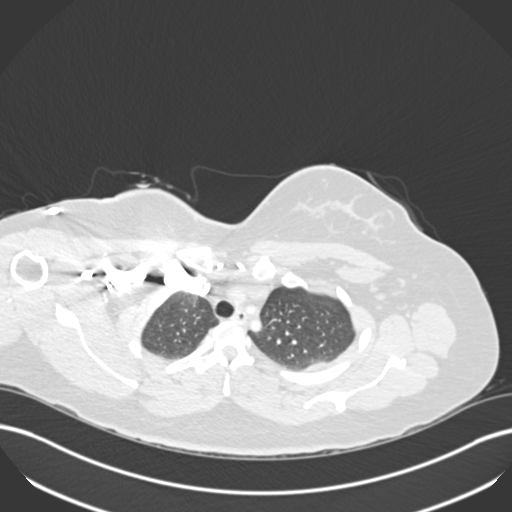
[im 282/314  soft-tissue]
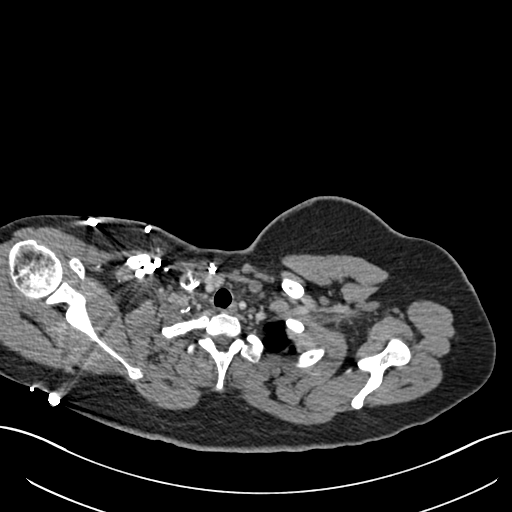
[im 298/314  lung]
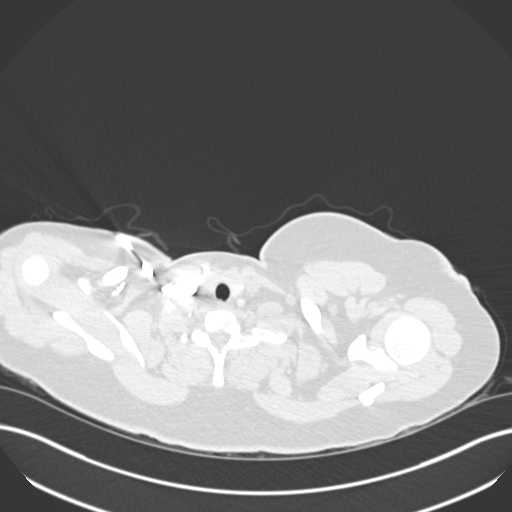

[Series 7: coronal mpr · coronal · 0.49mm/px · 3 of 106 slices shown]
[im 27/106  soft-tissue]
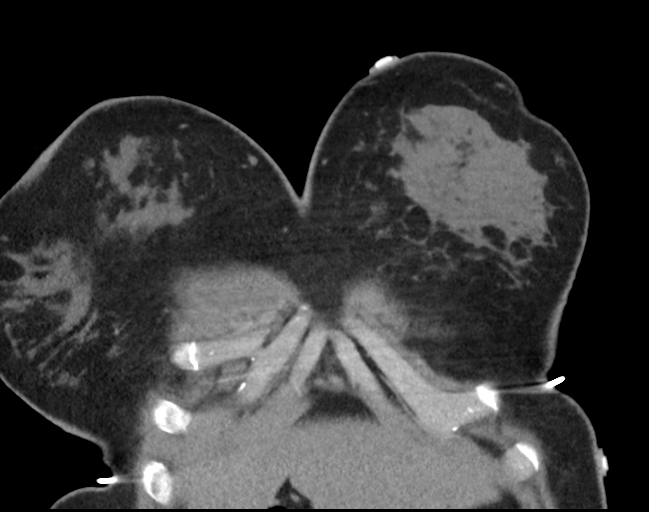
[im 53/106  soft-tissue]
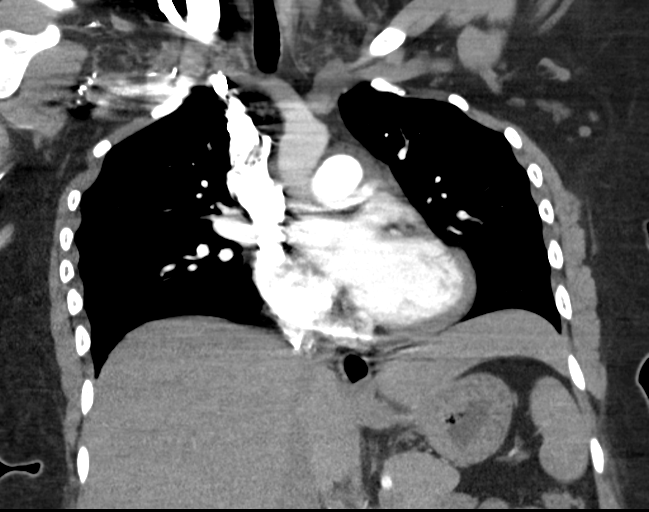
[im 79/106  soft-tissue]
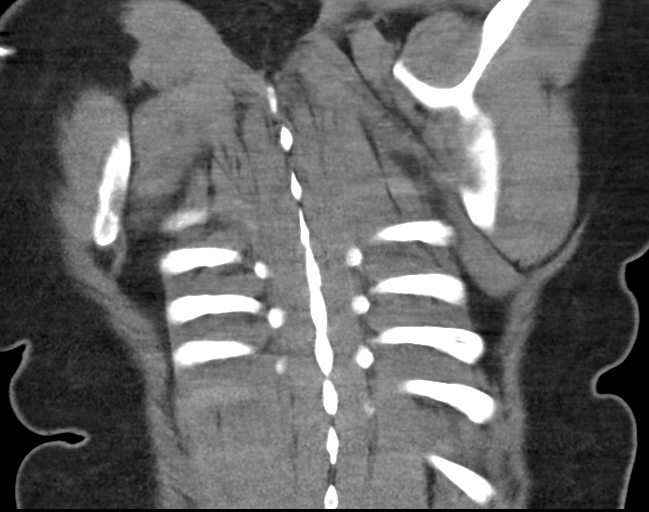

[18 of 46 positions shown; findings below may reference images not displayed]

RADIATION DOSE REDUCTION: This exam was performed according to the
departmental dose-optimization program which includes automated
exposure control, adjustment of the mA and/or kV according to
patient size and/or use of iterative reconstruction technique.

CONTRAST:  75mL OMNIPAQUE IOHEXOL 350 MG/ML SOLN
FINDINGS: Cardiovascular: Satisfactory opacification of pulmonary arteries
noted, and no pulmonary emboli identified. No evidence of thoracic
aortic dissection or aneurysm.

Mediastinum/Nodes: No masses or pathologically enlarged lymph nodes
identified.

Lungs/Pleura: No pulmonary mass, infiltrate, or effusion.

Upper abdomen: No acute findings.

Musculoskeletal: No suspicious bone lesions identified.

Review of the MIP images confirms the above findings.
IMPRESSION: Negative. No evidence of pulmonary embolism or other active disease.

## 2022-06-01 MED ORDER — IPRATROPIUM-ALBUTEROL 0.5-2.5 (3) MG/3ML IN SOLN
3.0000 mL | Freq: Once | RESPIRATORY_TRACT | Status: DC
Start: 2022-06-01 — End: 2022-06-01

## 2022-06-01 MED ORDER — IOHEXOL 350 MG/ML SOLN
75.0000 mL | Freq: Once | INTRAVENOUS | Status: AC | PRN
  Administered 2022-06-01: 75 mL via INTRAVENOUS

## 2022-06-01 NOTE — ED Provider Notes (Signed)
[facility] Provider Note    Event Date/Time   First MD Initiated Contact with Patient 06/01/22 1118     (approximate)   History   Chest Pain   HPI  Krista Garcia is a 26 y.o. female who is 3 months postpartum presents emergency department complaint of shortness of breath.  Patient had vaginal delivery and is breast-feeding.  Patient states she feels very short of breath especially with exertion.  No fever or chills.  No vomiting or diarrhea      Physical Exam   Triage Vital Signs: ED Triage Vitals  Enc Vitals Group     BP 06/01/22 1004 105/67     Pulse Rate 06/01/22 1004 76     Resp 06/01/22 1004 18     Temp 06/01/22 1004 98.5 F (36.9 C)     Temp Source 06/01/22 1004 Oral     SpO2 06/01/22 1004 93 %     Weight 06/01/22 1007 156 lb 8.4 oz (71 kg)     Height --      Head Circumference --      Peak Flow --      Pain Score 06/01/22 1006 8     Pain Loc --      Pain Edu? --      Excl. in [place]? --     Most recent vital signs: Vitals:   06/01/22 1004 06/01/22 1336  BP: 105/67 111/76  Pulse: 76 65  Resp: 18 18  Temp: 98.5 F (36.9 C)   SpO2: 93% 97%     General: Awake, no distress.   CV:  Good peripheral perfusion. regular rate and  rhythm Resp:  Normal effort. Lungs CTA, pulse ox decreased at 93% Abd:  No distention.   Other:      ED Results / Procedures / Treatments   Labs (all labs ordered are listed, but only abnormal results are displayed) Labs Reviewed  CBC WITH DIFFERENTIAL/PLATELET - Abnormal; Notable for the following components:      Result Value   Hemoglobin 11.9 (*)    MCH 25.6 (*)    All other components within normal limits  COMPREHENSIVE METABOLIC PANEL - Abnormal; Notable for the following components:   Glucose, Bld 131 (*)    Calcium 8.8 (*)    All other components within normal limits  BRAIN NATRIURETIC PEPTIDE  TROPONIN I (HIGH SENSITIVITY)  TROPONIN I (HIGH SENSITIVITY)      EKG  EKG   RADIOLOGY  Chest x-ray   PROCEDURES:   Procedures   MEDICATIONS ORDERED IN ED: Medications  iohexol (OMNIPAQUE) 350 MG/ML injection 75 mL (75 mLs Intravenous Contrast Given 06/01/22 1215)     IMPRESSION / MDM / ASSESSMENT AND PLAN / ED COURSE  I reviewed the triage vital signs and the nursing notes.                              Differential diagnosis includes, but is not limited to, PE, CAP, anxiety, CHF  Patient's presentation is most consistent with acute presentation with potential threat to life or bodily function.    Labs are reassuring, CBC basic metabolic panel troponin and BNP are all normal  CT for PE ordered   CT for PE interpreted by me as negative.  I did explain the findings to the patient.  She states it is more of a pressure and she does have a history of  a murmur and had to wear a Holter monitor for 3 months for her previous doctor.  Would like to get signed up with a cardiologist to assess her heart rate and palpitations.  Due to this information I did repeat her troponin which was normal.  I explained to the patient that she will need to see cardiology as her heart rate is normal here.  She may need to have another Holter monitor performed.  Patient is in agreement treatment plan.  All information was conveyed via the [facility] interpreter.  She was discharged in stable condition.   FINAL CLINICAL IMPRESSION(S) / ED DIAGNOSES   Final diagnoses:  Palpitations  Nonspecific chest pain     Rx / DC Orders   ED Discharge Orders     None        Note:  This document was prepared using Dragon voice recognition software and may include unintentional dictation errors.    Faythe Ghee, PA-C 06/01/22 1418    Dionne Bucy, MD 06/01/22 [phone]

## 2022-06-01 NOTE — Discharge Instructions (Signed)
Follow-up with cardiology.  Please call for an appointment.

## 2022-06-01 NOTE — ED Provider Triage Note (Signed)
Emergency Medicine Provider Triage Evaluation Note  Temple Ewart , a 26 y.o. female  was evaluated in triage.  Pt complains of shortness of breath, postpartum x 3 months with vaginal delivery .  Review of Systems  Positive: sob Negative: fever  Physical Exam  BP 105/67 (BP Location: Left Arm)   Pulse 76   Temp 98.5 F (36.9 C) (Oral)   Resp 18   Wt 71 kg   SpO2 93%   Breastfeeding Yes   BMI 28.17 kg/m  Gen:   Awake, no distress   Resp:  Increased, pulse ox low MSK:   Moves extremities without difficulty  Other:    Medical Decision Making  Medically screening exam initiated at 10:10 AM.  Appropriate orders placed.  Chanda Laperle Bran was informed that the remainder of the evaluation will be completed by another provider, this initial triage assessment does not replace that evaluation, and the importance of remaining in the ED until their evaluation is complete.     Faythe Ghee, PA-C 06/01/22 1011

## 2022-06-01 NOTE — ED Triage Notes (Signed)
Using interpreter: [phone]- Pt reports that she began to feel like her heart was racing and began having central chest pain with sob. Denies cough, fever.

## 2022-06-01 NOTE — ED Notes (Signed)
Dc ppw provided to patient. Pt questions and followup reviewed. PT declines vs at time of dc and provides verbal consent for DC. Pt alert and oriented on foot to lobby. 

## 2022-12-18 ENCOUNTER — Encounter: Payer: Self-pay | Admitting: Emergency Medicine

## 2022-12-18 ENCOUNTER — Emergency Department
Admission: EM | Admit: 2022-12-18 | Discharge: 2022-12-18 | Disposition: A | Discharge status: Home / Self Care | Payer: Medicaid Other | Attending: Emergency Medicine | Admitting: Emergency Medicine

## 2022-12-18 ENCOUNTER — Other Ambulatory Visit: Payer: Self-pay

## 2022-12-18 DIAGNOSIS — Z1152 Encounter for screening for COVID-19: Secondary | ICD-10-CM | POA: Insufficient documentation

## 2022-12-18 DIAGNOSIS — R350 Frequency of micturition: Secondary | ICD-10-CM | POA: Insufficient documentation

## 2022-12-18 DIAGNOSIS — J069 Acute upper respiratory infection, unspecified: Secondary | ICD-10-CM | POA: Diagnosis not present

## 2022-12-18 DIAGNOSIS — M791 Myalgia, unspecified site: Secondary | ICD-10-CM | POA: Insufficient documentation

## 2022-12-18 DIAGNOSIS — R059 Cough, unspecified: Secondary | ICD-10-CM | POA: Diagnosis present

## 2022-12-18 LAB — URINALYSIS, ROUTINE W REFLEX MICROSCOPIC
Bilirubin Urine: NEGATIVE
Glucose, UA: NEGATIVE mg/dL
Hgb urine dipstick: NEGATIVE
Ketones, ur: NEGATIVE mg/dL
Leukocytes,Ua: NEGATIVE
Nitrite: NEGATIVE
Protein, ur: NEGATIVE mg/dL
Specific Gravity, Urine: 1.013 (ref 1.005–1.030)
pH: 5 (ref 5.0–8.0)

## 2022-12-18 LAB — CBC WITH DIFFERENTIAL/PLATELET
Abs Immature Granulocytes: 0.05 10*3/uL (ref 0.00–0.07)
Basophils Absolute: 0 10*3/uL (ref 0.0–0.1)
Basophils Relative: 0 %
Eosinophils Absolute: 0.1 10*3/uL (ref 0.0–0.5)
Eosinophils Relative: 1 %
HCT: 41.4 % (ref 36.0–46.0)
Hemoglobin: 13.5 g/dL (ref 12.0–15.0)
Immature Granulocytes: 1 %
Lymphocytes Relative: 28 %
Lymphs Abs: 2.7 10*3/uL (ref 0.7–4.0)
MCH: 27.9 pg (ref 26.0–34.0)
MCHC: 32.6 g/dL (ref 30.0–36.0)
MCV: 85.5 fL (ref 80.0–100.0)
Monocytes Absolute: 0.7 10*3/uL (ref 0.1–1.0)
Monocytes Relative: 8 %
Neutro Abs: 6 10*3/uL (ref 1.7–7.7)
Neutrophils Relative %: 62 %
Platelets: 329 10*3/uL (ref 150–400)
RBC: 4.84 MIL/uL (ref 3.87–5.11)
RDW: 12.5 % (ref 11.5–15.5)
WBC: 9.6 10*3/uL (ref 4.0–10.5)
nRBC: 0 % (ref 0.0–0.2)

## 2022-12-18 LAB — BASIC METABOLIC PANEL
Anion gap: 8 (ref 5–15)
BUN: 11 mg/dL (ref 6–20)
CO2: 24 mmol/L (ref 22–32)
Calcium: 9.2 mg/dL (ref 8.9–10.3)
Chloride: 105 mmol/L (ref 98–111)
Creatinine, Ser: 0.66 mg/dL (ref 0.44–1.00)
GFR, Estimated: >60 mL/min (ref >60)
Glucose, Bld: 91 mg/dL (ref 70–99)
Potassium: 3.8 mmol/L (ref 3.5–5.1)
Sodium: 137 mmol/L (ref 135–145)

## 2022-12-18 LAB — RESP PANEL BY RT-PCR (RSV, FLU A&B, COVID)  RVPGX2
Influenza A by PCR: NEGATIVE
Influenza B by PCR: NEGATIVE
Resp Syncytial Virus by PCR: NEGATIVE
SARS Coronavirus 2 by RT PCR: NEGATIVE

## 2022-12-18 LAB — POC URINE PREG, ED: Preg Test, Ur: NEGATIVE

## 2022-12-18 NOTE — ED Triage Notes (Signed)
C?O feeling like half of head is falling asleep and half my face is numb.  Also c/o generalized body aches and ears clogging.  Feeling badly for 2 days.  Also c/o chills.  AAOx3.  Skin warm and dry. NAD  Tylenol taken at 0700.

## 2022-12-18 NOTE — Discharge Instructions (Signed)
Your blood work, urinalysis, and swab was normal.  You may continue to take Tylenol/ibuprofen per package instructions to help with your symptoms.  Please return for any new, worsening, or changes symptoms or concerns.  It was a pleasure caring for you today.

## 2022-12-18 NOTE — ED Provider Notes (Signed)
[facility] Provider Note    Event Date/Time   First MD Initiated Contact with Patient 12/18/22 1019     (approximate)   History   No chief complaint on file.   HPI  Krista Garcia is a 27 y.o. female who presents today for evaluation of body aches, urinary frequency, nasal congestion, and cough for the past 2 days.  She is unsure of any sick contacts.   No fevers, though she reports that she has chills.  She is taken Tylenol with limited improvement of her symptoms.  Patient Active Problem List   Diagnosis Date Noted   Post-dates pregnancy 03/07/2022   Nausea 03/05/2022   Labor and delivery, indication for care 09/17/2019   Pyelonephritis affecting pregnancy 09/03/2019   Pregnancy 06/28/2019          Physical Exam   Triage Vital Signs: ED Triage Vitals  Enc Vitals Group     BP 12/18/22 0958 106/71     Pulse Rate 12/18/22 0958 70     Resp 12/18/22 0958 16     Temp 12/18/22 0958 98.2 F (36.8 C)     Temp Source 12/18/22 0958 Oral     SpO2 12/18/22 0958 98 %     Weight 12/18/22 1007 156 lb 8.4 oz (71 kg)     Height 12/18/22 1007 5' 2.5" (1.588 m)     Head Circumference --      Peak Flow --      Pain Score 12/18/22 1006 8     Pain Loc --      Pain Edu? --      Excl. in [place]? --     Most recent vital signs: Vitals:   12/18/22 0958  BP: 106/71  Pulse: 70  Resp: 16  Temp: 98.2 F (36.8 C)  SpO2: 98%    Physical Exam Vitals and nursing note reviewed.  Constitutional:      General: Awake and alert. No acute distress.    Appearance: Normal appearance. The patient is normal weight.  HENT:     Head: Normocephalic and atraumatic.     Mouth: Mucous membranes are moist.  Nasal congestion present Eyes:     General: PERRL. Normal EOMs        Right eye: No discharge.        Left eye: No discharge.     Conjunctiva/sclera: Conjunctivae normal.  Cardiovascular:     Rate and Rhythm: Normal rate and regular rhythm.      Pulses: Normal pulses.  Pulmonary:     Effort: Pulmonary effort is normal. No respiratory distress.     Breath sounds: Normal breath sounds.  Abdominal:     Abdomen is soft. There is no abdominal tenderness. No rebound or guarding. No distention. Musculoskeletal:        General: No swelling. Normal range of motion.     Cervical back: Normal range of motion and neck supple.  Skin:    General: Skin is warm and dry.     Capillary Refill: Capillary refill takes less than 2 seconds.     Findings: No rash.  Neurological:     Mental Status: The patient is awake and alert.  Neurological: GCS 15 alert and oriented x3 Normal speech, no expressive or receptive aphasia or dysarthria Cranial nerves II through XII intact Normal visual fields 5 out of 5 strength in all 4 extremities with intact sensation throughout No extremity drift Normal finger-to-nose testing, no limb or  truncal ataxia      ED Results / Procedures / Treatments   Labs (all labs ordered are listed, but only abnormal results are displayed) Labs Reviewed  URINALYSIS, ROUTINE W REFLEX MICROSCOPIC - Abnormal; Notable for the following components:      Result Value   Color, Urine STRAW (*)    APPearance HAZY (*)    All other components within normal limits  RESP PANEL BY RT-PCR (RSV, FLU A&B, COVID)  RVPGX2  CBC WITH DIFFERENTIAL/PLATELET  BASIC METABOLIC PANEL  POC URINE PREG, ED     EKG     RADIOLOGY     PROCEDURES:  Critical Care performed:   Procedures   MEDICATIONS ORDERED IN ED: Medications - No data to display   IMPRESSION / MDM / [place] / ED COURSE  I reviewed the triage vital signs and the nursing notes.   Differential diagnosis includes, but is not limited to, influenza, COVID-19, RSV, URI, cystitis.  Patient is awake and alert, hemodynamically stable and afebrile.  She demonstrates no acute work of breathing.  Her lungs are clear to auscultation bilaterally.  Her abdomen is  soft and nontender with the exception of mild suprapubic discomfort.  No rebound or guarding.  No CVA tenderness to suggest pyelonephritis.  She has no focal neurological deficits.  Labs and urinalysis are normal.  Swab is negative.  I suspect that most likely etiology is URI especially with the runny nose, nasal congestion, and bodyaches.  We discussed symptomatic management and return precautions.  Patient understands and agrees with plan.  She was discharged in stable condition.  History, exam, findings, and recommendations discussed with the Spanish interpreter.   Patient's presentation is most consistent with acute complicated illness / injury requiring diagnostic workup.    FINAL CLINICAL IMPRESSION(S) / ED DIAGNOSES   Final diagnoses:  Upper respiratory tract infection, unspecified type     Rx / DC Orders   ED Discharge Orders     None        Note:  This document was prepared using Dragon voice recognition software and may include unintentional dictation errors.   Emeline Gins 12/18/22 1310    Vanessa Condon, MD 12/20/22 2317

## 2023-04-18 ENCOUNTER — Other Ambulatory Visit: Payer: Self-pay

## 2023-04-18 ENCOUNTER — Emergency Department
Admission: EM | Admit: 2023-04-18 | Discharge: 2023-04-18 | Disposition: A | Discharge status: Home / Self Care | Payer: Medicaid Other | Attending: Emergency Medicine | Admitting: Emergency Medicine

## 2023-04-18 DIAGNOSIS — Z20822 Contact with and (suspected) exposure to covid-19: Secondary | ICD-10-CM | POA: Insufficient documentation

## 2023-04-18 DIAGNOSIS — J069 Acute upper respiratory infection, unspecified: Secondary | ICD-10-CM | POA: Diagnosis not present

## 2023-04-18 DIAGNOSIS — R0981 Nasal congestion: Secondary | ICD-10-CM | POA: Diagnosis present

## 2023-04-18 LAB — RESP PANEL BY RT-PCR (RSV, FLU A&B, COVID)  RVPGX2
Influenza A by PCR: NEGATIVE
Influenza B by PCR: NEGATIVE
Resp Syncytial Virus by PCR: NEGATIVE
SARS Coronavirus 2 by RT PCR: NEGATIVE

## 2023-04-18 LAB — GROUP A STREP BY PCR: Group A Strep by PCR: NOT DETECTED

## 2023-04-18 MED ORDER — PSEUDOEPHEDRINE HCL 30 MG PO TABS: 60.0000 mg | ORAL_TABLET | Freq: Four times a day (QID) | ORAL | 0 refills | Status: AC | PRN

## 2023-04-18 NOTE — ED Notes (Signed)
Interpreter ([place]) used to assist this RN with the discharge and follow-up instructions for the pt. The pt is agreeable with the plan at this time.

## 2023-04-18 NOTE — ED Provider Notes (Signed)
[facility] Emergency Department Provider Note     Event Date/Time   First MD Initiated Contact with Patient 04/18/23 2008     (approximate)   History   Nasal Congestion (Patient is here tonight with complaints of a sore throat, runny nose, bodyaches, reported fever, and a headache x 3 days)   HPI  Krista Garcia is a 27 y.o. female who presents to the ED with complaint of productive nasal congestion and sore throat that started yesterday.  Patient has associated symptoms of unilateral headache and a productive cough with yellow, thick sputum.  Patient declines taking anything for palliative treatment.  She endorses her children at home recently recovered from a cold.  She denies recent travel, fever, shortness of breath, chest pain.  Physical Exam   Triage Vital Signs: ED Triage Vitals  Enc Vitals Group     BP 04/18/23 1948 113/75     Pulse Rate 04/18/23 1948 78     Resp 04/18/23 1948 19     Temp 04/18/23 1948 98.7 F (37.1 C)     Temp Source 04/18/23 1948 Oral     SpO2 04/18/23 1948 95 %     Weight --      Height --      Head Circumference --      Peak Flow --      Pain Score 04/18/23 1945 7     Pain Loc --      Pain Edu? --      Excl. in GC? --     Most recent vital signs: Vitals:   04/18/23 1948 04/18/23 2253  BP: 113/75 118/78  Pulse: 78 68  Resp: 19 16  Temp: 98.7 F (37.1 C) 98.2 F (36.8 C)  SpO2: 95% 98%   General: Well appearing. Alert and oriented. INAD.    Head:  NCAT.  Eyes:  PERRLA. EOMI. Conjunctivae clear. Ears:  Mild erythema of right EACs.  Pinna nontender bilaterally.  TMs translucent.  No bulging.   Nose:   Nasal septum is midline.  There is mild nasal turbinate edema bilaterally. Mouth/Throat:Uvula is midline.  Mild erythemic oropharyngeal. no tonsillar swelling or exudates.  Neck:   No cervical lymphadenopathy.  CV:  Good peripheral perfusion. RRR.  RESP:  Normal effort. LCTAB. No retractions.   ABD:  No distention. Soft. No tenderness to palpation.   ED Results / Procedures / Treatments   Labs (all labs ordered are listed, but only abnormal results are displayed) Labs Reviewed  RESP PANEL BY RT-PCR (RSV, FLU A&B, COVID)  RVPGX2  GROUP A STREP BY PCR    PROCEDURES:  Critical Care performed: No  Procedures   MEDICATIONS ORDERED IN ED: Medications - No data to display   IMPRESSION / MDM / ASSESSMENT AND PLAN / ED COURSE  I reviewed the triage vital signs and the nursing notes.                              Patient's presentation is most consistent with acute, uncomplicated illness.  Assessment:  27 year old female presents to the ED with acute congestion for 1 day. Vital signs are stable. Initial assessment suggests urgent evaluation of potential acute viral or bacterial infection.  Differential diagnosis includes, but is not limited to, URI, strep pharyngitis, otitis media, otitis external, COVID, RSV, viral illness.  Patient's respiratory panel and strep swab are reassuring.  Given the overall well-appearance  of the patient and essentially normal vital signs and benign physical exam findings, I have a very low suspicion of her having a serious arterial infection. I feel she is appropriate for discharge and outpatient follow up without additional evaluation in the emergency department at this time.  Patient's diagnosis is consistent with a viral upper respiratory infection. Patient will be discharged home with prescriptions for Sudafed. Patient is to follow up with her primary care as needed or otherwise directed.  Encourage patient to monitor symptoms for 10 days and if there is no improvement to follow-up with primary care or return to ED for evaluation of possible bacterial infection.  Patient is given ED precautions to return to the ED for any worsening or new symptoms.     FINAL CLINICAL IMPRESSION(S) / ED DIAGNOSES   Final diagnoses:  Acute upper  respiratory infection     Rx / DC Orders   ED Discharge Orders          Ordered    pseudoephedrine (SUDAFED) 30 MG tablet  Every 6 hours PRN        04/18/23 2243             Note:  This document was prepared using Dragon voice recognition software and may include unintentional dictation errors.    Romeo Apple, Jamaul Heist A, PA-C 04/18/23 2330    Phineas Semen, MD 04/19/23 [phone]

## 2023-04-18 NOTE — ED Triage Notes (Signed)
Patient is here tonight with complaints of a sore throat, runny nose, bodyaches, reported fever, and a headache x 3 days

## 2023-04-18 NOTE — Discharge Instructions (Addendum)
Pick up medication from [facility] pharmacy.  Medication will help with congestive symptoms. If symptoms do not improve after10 days please follow-up with your primary care physician.

## 2023-06-24 ENCOUNTER — Encounter: Payer: Self-pay | Admitting: Emergency Medicine

## 2023-06-24 ENCOUNTER — Emergency Department
Admission: EM | Admit: 2023-06-24 | Discharge: 2023-06-24 | Discharge status: Against Medical Advice | Payer: Medicaid Other | Source: Home / Self Care

## 2023-06-24 ENCOUNTER — Other Ambulatory Visit: Payer: Self-pay

## 2023-06-24 DIAGNOSIS — R519 Headache, unspecified: Secondary | ICD-10-CM | POA: Diagnosis not present

## 2023-06-24 DIAGNOSIS — Z5321 Procedure and treatment not carried out due to patient leaving prior to being seen by health care provider: Secondary | ICD-10-CM | POA: Insufficient documentation

## 2023-06-24 DIAGNOSIS — R42 Dizziness and giddiness: Secondary | ICD-10-CM | POA: Diagnosis present

## 2023-06-24 LAB — BASIC METABOLIC PANEL
Anion gap: 10 (ref 5–15)
BUN: 12 mg/dL (ref 6–20)
CO2: 21 mmol/L — ABNORMAL LOW (ref 22–32)
Calcium: 9.2 mg/dL (ref 8.9–10.3)
Chloride: 105 mmol/L (ref 98–111)
Creatinine, Ser: 0.52 mg/dL (ref 0.44–1.00)
GFR, Estimated: >60 mL/min (ref >60)
Glucose, Bld: 101 mg/dL — ABNORMAL HIGH (ref 70–99)
Potassium: 3.7 mmol/L (ref 3.5–5.1)
Sodium: 136 mmol/L (ref 135–145)

## 2023-06-24 LAB — CBC
HCT: 38.8 % (ref 36.0–46.0)
Hemoglobin: 13.4 g/dL (ref 12.0–15.0)
MCH: 29.1 pg (ref 26.0–34.0)
MCHC: 34.5 g/dL (ref 30.0–36.0)
MCV: 84.3 fL (ref 80.0–100.0)
Platelets: 306 10*3/uL (ref 150–400)
RBC: 4.6 MIL/uL (ref 3.87–5.11)
RDW: 12.6 % (ref 11.5–15.5)
WBC: 9.6 10*3/uL (ref 4.0–10.5)
nRBC: 0 % (ref 0.0–0.2)

## 2023-06-24 NOTE — ED Triage Notes (Signed)
Pt via POV from home. Pt c/o lightheaded, headache, and dizziness. States that symptoms started 1 month. States that she came in because it got worse today. Pt is A&Ox4 and NAD

## 2023-06-24 NOTE — ED Notes (Signed)
First Nurse Note: Pt to ED via [facility]. Pt presented to [facility] for headache, fatigue, and dizziness x 1 month. Pt states that she was seeing the migraine clinic but stopped going because she did not like the way they treated her. Pt is also c/o lower abdominal pain since she got her IUD placed. Doctor at [facility] wanted her sent to ED for evaluation.

## 2023-07-05 ENCOUNTER — Emergency Department: Payer: Medicaid Other

## 2023-07-05 ENCOUNTER — Other Ambulatory Visit: Payer: Self-pay

## 2023-07-05 ENCOUNTER — Emergency Department
Admission: EM | Admit: 2023-07-05 | Discharge: 2023-07-05 | Disposition: A | Discharge status: Home / Self Care | Payer: Medicaid Other | Attending: Emergency Medicine | Admitting: Emergency Medicine

## 2023-07-05 DIAGNOSIS — R102 Pelvic and perineal pain: Secondary | ICD-10-CM | POA: Diagnosis not present

## 2023-07-05 DIAGNOSIS — R519 Headache, unspecified: Secondary | ICD-10-CM | POA: Insufficient documentation

## 2023-07-05 DIAGNOSIS — R350 Frequency of micturition: Secondary | ICD-10-CM | POA: Diagnosis not present

## 2023-07-05 DIAGNOSIS — G8929 Other chronic pain: Secondary | ICD-10-CM

## 2023-07-05 LAB — URINALYSIS, ROUTINE W REFLEX MICROSCOPIC
Bilirubin Urine: NEGATIVE
Glucose, UA: NEGATIVE mg/dL
Hgb urine dipstick: NEGATIVE
Ketones, ur: NEGATIVE mg/dL
Nitrite: NEGATIVE
Protein, ur: NEGATIVE mg/dL
Specific Gravity, Urine: 1.018 (ref 1.005–1.030)
pH: 5 (ref 5.0–8.0)

## 2023-07-05 LAB — POC URINE PREG, ED: Preg Test, Ur: NEGATIVE

## 2023-07-05 LAB — BASIC METABOLIC PANEL
Anion gap: 7 (ref 5–15)
BUN: 13 mg/dL (ref 6–20)
CO2: 24 mmol/L (ref 22–32)
Calcium: 9 mg/dL (ref 8.9–10.3)
Chloride: 107 mmol/L (ref 98–111)
Creatinine, Ser: 0.65 mg/dL (ref 0.44–1.00)
GFR, Estimated: >60 mL/min (ref >60)
Glucose, Bld: 132 mg/dL — ABNORMAL HIGH (ref 70–99)
Potassium: 3.5 mmol/L (ref 3.5–5.1)
Sodium: 138 mmol/L (ref 135–145)

## 2023-07-05 LAB — CBC
HCT: 40.1 % (ref 36.0–46.0)
Hemoglobin: 13.7 g/dL (ref 12.0–15.0)
MCH: 28.9 pg (ref 26.0–34.0)
MCHC: 34.2 g/dL (ref 30.0–36.0)
MCV: 84.6 fL (ref 80.0–100.0)
Platelets: 287 10*3/uL (ref 150–400)
RBC: 4.74 MIL/uL (ref 3.87–5.11)
RDW: 12.6 % (ref 11.5–15.5)
WBC: 8.3 10*3/uL (ref 4.0–10.5)
nRBC: 0 % (ref 0.0–0.2)

## 2023-07-05 LAB — TROPONIN I (HIGH SENSITIVITY): Troponin I (High Sensitivity): <2 ng/L (ref <18)

## 2023-07-05 MED ORDER — ACETAMINOPHEN 325 MG PO TABS
650.0000 mg | ORAL_TABLET | Freq: Once | ORAL | Status: AC
  Administered 2023-07-05: 650 mg via ORAL
  Filled 2023-07-05: qty 2

## 2023-07-05 MED ORDER — SODIUM CHLORIDE 0.9 % IV BOLUS
1000.0000 mL | Freq: Once | INTRAVENOUS | Status: AC
  Administered 2023-07-05: 1000 mL via INTRAVENOUS

## 2023-07-05 NOTE — Discharge Instructions (Addendum)
Your [place] did not find any abnormalities. Your chest xray was normal. Your blood work was normal. Your urinalysis did not show infection, but I am sending your urine for culture. If the culture shows infection someone will call you and you will be prescribed an antibiotic.   You can take 650 mg of tylenol every 6 hours as needed for pain. Make sure you stay well hydrated.

## 2023-07-05 NOTE — ED Triage Notes (Signed)
Pt states coming in with a headache and abdominal pain. Pt states coming in earlier this month, but left before being seen. Pt states the pain has been coming and going. Pt reports some dizziness as well.  Interpreter [phone] used

## 2023-07-05 NOTE — ED Provider Notes (Signed)
[facility] Provider Note    Event Date/Time   First MD Initiated Contact with Patient 07/05/23 1110     (approximate)   History   Headache and Abdominal Pain   HPI  Krista Garcia is a 27 y.o. female G2 P2-0-0-2 who presents for evaluation of headache and lower abdominal pain x 1 month.  Patient states that she had a Nexplanon implanted after she delivered in March and has had lower abdominal/pelvic pain since.  She also endorses chest pain radiating into her arm x 1 month.  Patient denies N/V/C/D, dysuria, vaginal bleeding, vaginal discharge, fevers.  Patient does endorse urinary frequency.      Physical Exam   Triage Vital Signs: ED Triage Vitals  Encounter Vitals Group     BP 07/05/23 1050 123/89     Systolic BP Percentile --      Diastolic BP Percentile --      Pulse Rate 07/05/23 1050 70     Resp 07/05/23 1050 16     Temp 07/05/23 1050 97.9 F (36.6 C)     Temp src --      SpO2 07/05/23 1050 98 %     Weight 07/05/23 1109 156 lb 8.4 oz (71 kg)     Height 07/05/23 1109 5' 2.25" (1.581 m)     Head Circumference --      Peak Flow --      Pain Score 07/05/23 1048 9     Pain Loc --      Pain Education --      Exclude from Growth Chart --     Most recent vital signs: Vitals:   07/05/23 1353 07/05/23 1512  BP: 105/71 111/74  Pulse: (!) 58 65  Resp: 16 18  Temp:    SpO2: 100% 100%    General: Awake, no distress.  CV:  Good peripheral perfusion. RRR. Resp:  Normal effort.  CTAB. Abd:  No distention.  Soft, tender to palpation in lower abdomen.  Bowel sounds appropriate.   ED Results / Procedures / Treatments   Labs (all labs ordered are listed, but only abnormal results are displayed) Labs Reviewed  BASIC METABOLIC PANEL - Abnormal; Notable for the following components:      Result Value   Glucose, Bld 132 (*)    All other components within normal limits  URINALYSIS, ROUTINE W REFLEX MICROSCOPIC - Abnormal; Notable  for the following components:   Color, Urine YELLOW (*)    APPearance HAZY (*)    Leukocytes,Ua MODERATE (*)    Bacteria, UA FEW (*)    All other components within normal limits  POC URINE PREG, ED - Normal  URINE CULTURE  CBC  CBG MONITORING, ED  TROPONIN I (HIGH SENSITIVITY)  TROPONIN I (HIGH SENSITIVITY)     EKG  EKG as interpreted by me: NSR with nonspecific ST abnormality VR 65 bpm PR 116 MS QRS 74 MS  RADIOLOGY  Chest x-ray obtained, interpreted the images as well as reviewed the radiologist report.  PROCEDURES:  Critical Care performed: No  Procedures   MEDICATIONS ORDERED IN ED: Medications  sodium chloride 0.9 % bolus 1,000 mL (0 mLs Intravenous Stopped 07/05/23 1303)  acetaminophen (TYLENOL) tablet 650 mg (650 mg Oral Given 07/05/23 1317)     IMPRESSION / MDM / ASSESSMENT AND PLAN / ED COURSE  I reviewed the triage vital signs and the nursing notes.  27 year old female presents for evaluation of headache, abdominal pain and chest pain x 1 month.  VSS in triage, patient NAD on exam.  Differential diagnosis includes, but is not limited to, dehydration, fatigue, uterine fibroids, ovarian cyst, UTI, postpartum pelvic floor dysfunction.  Patient's presentation is most consistent with acute complicated illness / injury requiring diagnostic workup.  Chest x-ray obtained as patient reported ongoing chest pain.  I interpreted the images as well as reviewed the radiologist report which was negative for any acute intracranial pathology.  EKG showed NSR with nonspecific ST wave abnormality, so I obtained a troponin which was not elevated.  Pelvic ultrasound obtained to evaluate patient's ongoing lower abdominal pelvic pain.  I interpreted the images as well as reviewed the radiologist report which did not show any uterine fibroids, ovarian cysts and was essentially normal.  CBC and BMP unremarkable aside from an elevated glucose.  UA notable  for moderate leukocytes with few bacteria.  I am sending urine for culture.    I suspect patient's headache is secondary to fatigue and dehydration as she has a new baby. I explained that patient can take tylenol as needed for pain. I advised her to follow up with OBGYN and gave her a referral.  Given that her work up was negative, I feel she is stable for outpatient management. Patient was agreeable to plan, all questions were answered and she was stable at discharge.       FINAL CLINICAL IMPRESSION(S) / ED DIAGNOSES   Final diagnoses:  Pelvic pain  Chronic nonintractable headache, unspecified headache type     Rx / DC Orders   ED Discharge Orders     None        Note:  This document was prepared using Dragon voice recognition software and may include unintentional dictation errors.   Cameron Ali, PA-C 07/05/23 1513    Sharyn Creamer, MD 07/05/23 [place]

## 2023-07-06 LAB — URINE CULTURE

## 2023-07-07 LAB — URINE CULTURE: Culture: >=100000 — AB

## 2023-12-31 ENCOUNTER — Encounter: Payer: Self-pay | Admitting: Advanced Practice Midwife

## 2024-01-20 ENCOUNTER — Ambulatory Visit (INDEPENDENT_AMBULATORY_CARE_PROVIDER_SITE_OTHER): Payer: Medicaid Other | Admitting: Advanced Practice Midwife

## 2024-01-20 ENCOUNTER — Other Ambulatory Visit (HOSPITAL_COMMUNITY)
Admission: RE | Admit: 2024-01-20 | Discharge: 2024-01-20 | Disposition: A | Discharge status: Home / Self Care | Payer: Medicaid Other | Source: Ambulatory Visit | Attending: Advanced Practice Midwife | Admitting: Advanced Practice Midwife

## 2024-01-20 ENCOUNTER — Encounter: Payer: Self-pay | Admitting: Advanced Practice Midwife

## 2024-01-20 VITALS — BP 115/69 | HR 76 | Wt 174.7 lb

## 2024-01-20 DIAGNOSIS — Z113 Encounter for screening for infections with a predominantly sexual mode of transmission: Secondary | ICD-10-CM

## 2024-01-20 DIAGNOSIS — N898 Other specified noninflammatory disorders of vagina: Secondary | ICD-10-CM | POA: Diagnosis present

## 2024-01-20 DIAGNOSIS — R102 Pelvic and perineal pain: Secondary | ICD-10-CM | POA: Diagnosis present

## 2024-01-20 NOTE — Progress Notes (Signed)
 Patient ID: Krista Garcia, female   DOB: 1996-05-07, 28 y.o.   MRN: [id number]  Reason for visit: Pelvic pain for 2 years   Subjective:  HPI:  Interpreter present  Krista Garcia is a 28 y.o. female being seen for pelvic pain she has had for nearly 2 years ever since the birth of her daughter. The pain is in her lower abdomen and is described as tender or like bruising. It is constant and generally getting worse. Several exacerbating factors; steps, standing or sitting too long. Has tried OTC pain medications which don't help. She admits occasional diarrhea and has gas/bloating after eating. Has leakage of urine when coughing, sneezing, etc. She reports vaginal odor that is different and has concern for possible exposure to STDs. Uses Nexplanon  for birth control and has no bleeding. Was seen in the ER and had a normal pelvic ultrasound in July of 2024. At that time she reported a 1 month history of abdominal pain.  Discussed possible etiologies including; PID, endometriosis, ovarian cyst, gastro-intestinal disorder  Past Medical History:  Diagnosis Date   Heart murmur    Heart murmur    Medical history non-contributory    Family History  Problem Relation Age of Onset   Diabetes Father    Diabetes Paternal Grandmother    Stroke Paternal Grandmother    Diabetes Paternal Grandfather    Breast cancer Neg Hx    Ovarian cancer Neg Hx    Colon cancer Neg Hx    Past Surgical History:  Procedure Laterality Date   BREAST CYST EXCISION Left    BREAST SURGERY      Short Social History:  Social History   Tobacco Use   Smoking status: Never   Smokeless tobacco: Never  Substance Use Topics   Alcohol use: Not Currently    Allergies  Allergen Reactions   Shellfish Allergy     Pt stated that she is not aware of a shellfish allergy 03/05/22     Current Outpatient Medications  Medication Sig Dispense Refill   acetaminophen  (TYLENOL ) 325 MG tablet Take 650  mg by mouth every 6 (six) hours as needed.     No current facility-administered medications for this visit.    Review of Systems  Constitutional:  Negative for chills and fever.  HENT:  Negative for congestion, ear discharge, ear pain, hearing loss, sinus pain and sore throat.   Eyes:  Negative for blurred vision and double vision.  Respiratory:  Negative for cough, shortness of breath and wheezing.   Cardiovascular:  Negative for chest pain, palpitations and leg swelling.  Gastrointestinal:  Positive for abdominal pain and diarrhea. Negative for blood in stool, constipation, heartburn, melena, nausea and vomiting.  Genitourinary:  Positive for urgency. Negative for dysuria, flank pain, frequency and hematuria.       Positive for vaginal odor  Musculoskeletal:  Negative for back pain, joint pain and myalgias.  Skin:  Negative for itching and rash.  Neurological:  Negative for dizziness, tingling, tremors, sensory change, speech change, focal weakness, seizures, loss of consciousness, weakness and headaches.  Endo/Heme/Allergies:  Negative for environmental allergies. Does not bruise/bleed easily.  Psychiatric/Behavioral:  Negative for depression, hallucinations, memory loss, substance abuse and suicidal ideas. The patient is not nervous/anxious and does not have insomnia.        Objective:  Objective   Vitals:   01/20/24 1417  BP: 115/69  Pulse: 76  Weight: 174 lb 11.2 oz (79.2 kg)  Body mass index is 33.01 kg/m. Constitutional: Well nourished, well developed female in no acute distress.  HEENT: normal Skin: Warm and dry.  Cardiovascular: Regular rate and rhythm.   Respiratory: Clear to auscultation bilateral. Normal respiratory effort Abdomen: suprapubic tenderness to palpation Psych: Alert and Oriented x3. No memory deficits. Normal mood and affect.    Pelvic exam: (female chaperone present) Is slightly  limited by body habitus EGBUS: within normal limits Vagina:  mucosa appears irritated, min to mod muscle strength No CMT Uterus: Mid position, mildly tender to palpation Adnexa: Right is mildly tender to palpation, no masses. Left is moderately tender to palpation, possibly enlarged    Assessment/Plan:     28 y.o. G2 P31 female with chronic pelvic/low abdomen pain Gyn vs GI  Pelvic ultrasound Consider endometriosis or GI cause Aptima: STD/vaginitis Follow up after ultrasound/labs   Slater Rains, CNM [facility] 01/20/2024 6:47 PM

## 2024-01-20 NOTE — Patient Instructions (Signed)
Dolor plvico en la mujer Pelvic Pain, Female El dolor plvico se siente en la parte inferior del abdomen, debajo del ombligo y a nivel de las caderas. El dolor puede comenzar en forma repentina (ser [employer]), reaparecer (ser recurrente) o durar mucho tiempo (volverse crnico). El dolor plvico que dura ms de 6 meses se considera crnico. El dolor plvico puede afectar: Los rganos genitales. El sistema urinario. El tubo digestivo. El sistema musculoesqueltico. El dolor plvico puede tener muchas causas posibles. A veces, el dolor puede ser una consecuencia de afecciones digestivas o urinarias, msculos o ligamentos distendidos, o afecciones del aparato reproductivo. A veces, la causa del dolor plvico no se conoce. Siga estas instrucciones en su casa:  Use los medicamentos de venta libre y los recetados solamente como se lo haya indicado el mdico. Haga reposo como se lo haya indicado el mdico. No tenga sexo si siente dolor. Lleve un registro del dolor plvico. Escriba los siguientes datos: Cundo comenz Chief Technology Officer. La ubicacin del dolor. Qu parece mejorar o Administrator, arts, como alimentos o su perodo mensual (ciclo menstrual). Cualquier sntoma que se presente junto con Chief Technology Officer. Concurra a todas las visitas de seguimiento. Esto es importante. Comunquese con un mdico si: El medicamento no [employer], o el dolor regresa. Aparecen nuevos sntomas. Tiene sangrado o secrecin vaginal anormal, lo que incluye sangrado despus de la menopausia. Tiene fiebre o escalofros. Tiene estreimiento. Observa sangre en la orina o en la materia fecal (heces). La orina tiene mal olor. Se siente dbil o siente que va a desvanecerse. Solicite ayuda de inmediato si: Tiene un dolor repentino e intenso. El dolor se intensifica de [place] continua. Siente dolor intenso junto con fiebre, nuseas, vmitos o sudoracin excesiva. Pierde la conciencia. Estos sntomas pueden representar un problema  grave que constituye Radio broadcast assistant. No espere a ver si los sntomas desaparecen. Solicite atencin mdica de inmediato. Comunquese con el servicio de emergencias de su localidad (911 en los [place]). No conduzca por sus propios medios [employer]. Resumen El dolor plvico se siente en la parte inferior del abdomen, debajo del ombligo y a nivel de las caderas. El dolor plvico puede tener muchas causas posibles. Lleve un registro del dolor plvico. Esta informacin no tiene Theme park manager el consejo del mdico. Asegrese de hacerle al mdico cualquier pregunta que tenga. Document Revised: 05/02/2021 Document Reviewed: 05/02/2021 Elsevier Patient Education  2024 [employer].

## 2024-01-22 LAB — CERVICOVAGINAL ANCILLARY ONLY
Bacterial Vaginitis (gardnerella): POSITIVE — AB
Candida Glabrata: NEGATIVE
Candida Vaginitis: POSITIVE — AB
Chlamydia: NEGATIVE
Comment: NEGATIVE
Comment: NEGATIVE
Comment: NEGATIVE
Comment: NEGATIVE
Comment: NEGATIVE
Comment: NORMAL
Neisseria Gonorrhea: NEGATIVE
Trichomonas: NEGATIVE

## 2024-02-07 ENCOUNTER — Other Ambulatory Visit: Payer: Self-pay | Admitting: Advanced Practice Midwife

## 2024-02-07 DIAGNOSIS — B3731 Acute candidiasis of vulva and vagina: Secondary | ICD-10-CM

## 2024-02-07 DIAGNOSIS — B9689 Other specified bacterial agents as the cause of diseases classified elsewhere: Secondary | ICD-10-CM

## 2024-02-07 MED ORDER — METRONIDAZOLE 500 MG PO TABS
500.0000 mg | ORAL_TABLET | Freq: Two times a day (BID) | ORAL | 0 refills | Status: AC
Start: 2024-02-07 — End: 2024-02-14

## 2024-02-07 MED ORDER — FLUCONAZOLE 150 MG PO TABS
150.0000 mg | ORAL_TABLET | Freq: Once | ORAL | 1 refills | Status: AC
Start: 2024-02-07 — End: 2024-02-07

## 2024-02-07 NOTE — Progress Notes (Signed)
 Patient called (no answer or ability to leave message) regarding BV and yeast. L/M on alternate contact phone number. Medications sent.

## 2024-02-08 ENCOUNTER — Telehealth: Payer: Self-pay

## 2024-02-08 NOTE — Telephone Encounter (Signed)
-----   Message from Tresea Mall sent at 02/07/2024  2:53 PM EST ----- Krista Garcia can you please send this patient a letter (mail to home address) or try calling her (I was unable to reach her today) about her lab result- BV and yeast and her medications are at the [facility]. Thank you, Erskine Squibb

## 2024-02-08 NOTE — Telephone Encounter (Signed)
 Called pt, taken straight to voicemail. Mailbox not available.

## 2024-02-11 NOTE — Telephone Encounter (Signed)
 Tried again, mail box full and unable to leave voice msg.

## 2024-02-17 ENCOUNTER — Other Ambulatory Visit: Payer: Medicaid Other

## 2024-02-17 ENCOUNTER — Telehealth: Payer: Self-pay | Admitting: Advanced Practice Midwife

## 2024-02-17 NOTE — Telephone Encounter (Signed)
 Tried again, no answer, unable to leave voice msg. Letter printed and mailed.

## 2024-02-17 NOTE — Telephone Encounter (Signed)
 Reached out to pt via interpreter about about [place] that was scheduled on 02/17/2024 at 9:15 per JEG.  Gave number to Centralized Scheduling via interpreter so that pt could call and get [place] rescheduled.

## 2024-02-19 ENCOUNTER — Encounter: Payer: Self-pay | Admitting: Advanced Practice Midwife

## 2024-02-21 ENCOUNTER — Other Ambulatory Visit: Payer: Self-pay

## 2024-02-21 ENCOUNTER — Emergency Department

## 2024-02-21 ENCOUNTER — Emergency Department
Admission: EM | Admit: 2024-02-21 | Discharge: 2024-02-22 | Disposition: A | Discharge status: Home / Self Care | Attending: Emergency Medicine | Admitting: Emergency Medicine

## 2024-02-21 DIAGNOSIS — R1032 Left lower quadrant pain: Secondary | ICD-10-CM | POA: Diagnosis present

## 2024-02-21 DIAGNOSIS — G8929 Other chronic pain: Secondary | ICD-10-CM

## 2024-02-21 DIAGNOSIS — R110 Nausea: Secondary | ICD-10-CM | POA: Diagnosis not present

## 2024-02-21 LAB — COMPREHENSIVE METABOLIC PANEL
ALT: 28 U/L (ref 0–44)
AST: 23 U/L (ref 15–41)
Albumin: 4.2 g/dL (ref 3.5–5.0)
Alkaline Phosphatase: 81 U/L (ref 38–126)
Anion gap: 8 (ref 5–15)
BUN: 17 mg/dL (ref 6–20)
CO2: 25 mmol/L (ref 22–32)
Calcium: 9 mg/dL (ref 8.9–10.3)
Chloride: 105 mmol/L (ref 98–111)
Creatinine, Ser: 0.61 mg/dL (ref 0.44–1.00)
GFR, Estimated: >60 mL/min (ref >60)
Glucose, Bld: 113 mg/dL — ABNORMAL HIGH (ref 70–99)
Potassium: 3.9 mmol/L (ref 3.5–5.1)
Sodium: 138 mmol/L (ref 135–145)
Total Bilirubin: 0.4 mg/dL (ref 0.0–1.2)
Total Protein: 7.5 g/dL (ref 6.5–8.1)

## 2024-02-21 LAB — LIPASE, BLOOD: Lipase: 36 U/L (ref 11–51)

## 2024-02-21 LAB — CBC
HCT: 39.2 % (ref 36.0–46.0)
Hemoglobin: 12.9 g/dL (ref 12.0–15.0)
MCH: 28.8 pg (ref 26.0–34.0)
MCHC: 32.9 g/dL (ref 30.0–36.0)
MCV: 87.5 fL (ref 80.0–100.0)
Platelets: 294 10*3/uL (ref 150–400)
RBC: 4.48 MIL/uL (ref 3.87–5.11)
RDW: 12.4 % (ref 11.5–15.5)
WBC: 10.4 10*3/uL (ref 4.0–10.5)
nRBC: 0 % (ref 0.0–0.2)

## 2024-02-21 LAB — URINALYSIS, ROUTINE W REFLEX MICROSCOPIC
Bilirubin Urine: NEGATIVE
Glucose, UA: NEGATIVE mg/dL
Hgb urine dipstick: NEGATIVE
Ketones, ur: NEGATIVE mg/dL
Nitrite: NEGATIVE
Protein, ur: NEGATIVE mg/dL
Specific Gravity, Urine: 1.018 (ref 1.005–1.030)
pH: 6 (ref 5.0–8.0)

## 2024-02-21 LAB — PREGNANCY, URINE: Preg Test, Ur: NEGATIVE

## 2024-02-21 MED ORDER — MORPHINE SULFATE (PF) 4 MG/ML IV SOLN
4.0000 mg | Freq: Once | INTRAVENOUS | Status: AC
  Administered 2024-02-21: 4 mg via INTRAVENOUS
  Filled 2024-02-21: qty 1

## 2024-02-21 MED ORDER — FAMOTIDINE IN NACL 20-0.9 MG/50ML-% IV SOLN
20.0000 mg | Freq: Once | INTRAVENOUS | Status: AC
  Administered 2024-02-21: 20 mg via INTRAVENOUS
  Filled 2024-02-21: qty 50

## 2024-02-21 MED ORDER — KETOROLAC TROMETHAMINE 30 MG/ML IJ SOLN
30.0000 mg | Freq: Once | INTRAMUSCULAR | Status: AC
  Administered 2024-02-22: 30 mg via INTRAVENOUS
  Filled 2024-02-21: qty 1

## 2024-02-21 MED ORDER — ONDANSETRON HCL 4 MG/2ML IJ SOLN
4.0000 mg | Freq: Once | INTRAMUSCULAR | Status: AC
  Administered 2024-02-21: 4 mg via INTRAVENOUS
  Filled 2024-02-21: qty 2

## 2024-02-21 MED ORDER — IOHEXOL 300 MG/ML  SOLN
100.0000 mL | Freq: Once | INTRAMUSCULAR | Status: AC | PRN
  Administered 2024-02-21: 100 mL via INTRAVENOUS

## 2024-02-21 NOTE — ED Provider Notes (Signed)
 [facility] Provider Note    Event Date/Time   First MD Initiated Contact with Patient 02/21/24 2155     (approximate)   History   Abdominal Pain   HPI Krista Garcia is a 28 y.o. female presenting today for left lower quadrant abdominal pain.  Patient states onset of symptoms yesterday being present both in the epigastric and left lower quadrant.  Has had some associated nausea but no vomiting.  She feels she may be constipated but the pain has been getting worse.  Denies fever, chills, cough, congestion, dysuria, hematuria.  No prior abdominal surgeries.     Physical Exam   Triage Vital Signs: ED Triage Vitals  Encounter Vitals Group     BP 02/21/24 2116 116/78     Systolic BP Percentile --      Diastolic BP Percentile --      Pulse Rate 02/21/24 2116 91     Resp 02/21/24 2116 16     Temp 02/21/24 2116 97.8 F (36.6 C)     Temp src --      SpO2 02/21/24 2116 100 %     Weight 02/21/24 2122 180 lb (81.6 kg)     Height 02/21/24 2122 5\' 1"  (1.549 m)     Head Circumference --      Peak Flow --      Pain Score 02/21/24 2122 8     Pain Loc --      Pain Education --      Exclude from Growth Chart --     Most recent vital signs: Vitals:   02/21/24 2116  BP: 116/78  Pulse: 91  Resp: 16  Temp: 97.8 F (36.6 C)  SpO2: 100%   Physical Exam: I have reviewed the vital signs and nursing notes. General: Awake, alert, no acute distress.  Nontoxic appearing. Head:  Atraumatic, normocephalic.   ENT:  EOM intact, PERRL. Oral mucosa is pink and moist with no lesions. Neck: Neck is supple with full range of motion, No meningeal signs. Cardiovascular:  RRR, No murmurs. Peripheral pulses palpable and equal bilaterally. Respiratory:  Symmetrical chest wall expansion.  No rhonchi, rales, or wheezes.  Good air movement throughout.  No use of accessory muscles.   Musculoskeletal:  No cyanosis or edema. Moving extremities with full ROM Abdomen:   Soft, sharp tenderness palpation left lower quadrant with mild tenderness palpation right lower quadrant, nondistended. Neuro:  GCS 15, moving all four extremities, interacting appropriately. Speech clear. Psych:  Calm, appropriate.   Skin:  Warm, dry, no rash.    ED Results / Procedures / Treatments   Labs (all labs ordered are listed, but only abnormal results are displayed) Labs Reviewed  COMPREHENSIVE METABOLIC PANEL - Abnormal; Notable for the following components:      Result Value   Glucose, Bld 113 (*)    All other components within normal limits  URINALYSIS, ROUTINE W REFLEX MICROSCOPIC - Abnormal; Notable for the following components:   Color, Urine YELLOW (*)    APPearance HAZY (*)    Leukocytes,Ua LARGE (*)    Bacteria, UA RARE (*)    All other components within normal limits  LIPASE, BLOOD  CBC  PREGNANCY, URINE     EKG    RADIOLOGY CT pending at time of signout   PROCEDURES:  Critical Care performed: No  Procedures   MEDICATIONS ORDERED IN ED: Medications  famotidine (PEPCID) IVPB 20 mg premix (20 mg Intravenous New Bag/Given 02/21/24  2303)  ondansetron (ZOFRAN) injection 4 mg (4 mg Intravenous Given 02/21/24 2304)  morphine (PF) 4 MG/ML injection 4 mg (4 mg Intravenous Given 02/21/24 2303)     IMPRESSION / MDM / ASSESSMENT AND PLAN / ED COURSE  I reviewed the triage vital signs and the nursing notes.                              Differential diagnosis includes, but is not limited to, constipation, diverticulitis, appendicitis, colitis, enteritis, nephrolithiasis, pyelonephritis  Patient's presentation is most consistent with acute complicated illness / injury requiring diagnostic workup.  Patient is a 28 year old female presenting today for left lower quadrant pain associated with nausea.  Some of her epigastric pain appears more chronic.  Tender to palpation of left lower quadrant more so than her right lower quadrant.  No vaginal symptoms.  No  dysuria.  Laboratory workup largely reassuring with normal CBC, CMP, lipase.  UA shows no definitive sign of a UTI especially with no dysuria symptoms.  Will get CT imaging to rule out acute intra-abdominal infection.  Will treat with Pepcid, Zofran, and morphine.  Patient signed out to oncoming provider pending results of CT imaging.  If unremarkable, suspect she can be discharged with treatment for potential gastritis symptoms and constipation.     FINAL CLINICAL IMPRESSION(S) / ED DIAGNOSES   Final diagnoses:  Left lower quadrant abdominal pain     Rx / DC Orders   ED Discharge Orders     None        Note:  This document was prepared using Dragon voice recognition software and may include unintentional dictation errors.   Janith Lima, MD 02/21/24 2308

## 2024-02-21 NOTE — Discharge Instructions (Signed)
 I recommend that you increase your water and fiber intake. If you are not able to eat foods high in fiber, you may use Benefiber or Metamucil over-the-counter. I also recommend you use MiraLAX 1-2 times a day and Colace 100 mg twice a day to help with bowel movements. These medications are over the counter.  You may use other over-the-counter medications such as Dulcolax, Fleet enemas, magnesium citrate as needed for constipation. Please note that some of these medications may cause you to have abdominal cramping which is normal. If you develop severe abdominal pain, fever (temperature of 100.4 or higher), persistent vomiting, distention of your abdomen, unable to have a bowel movement for 5 days or are not passing gas, please return to the hospital.

## 2024-02-21 NOTE — ED Triage Notes (Signed)
 Pt arrived POV for abd pain to her left flank and upper abd, some nausea, no vomiting or diarrhea. Reports hard to use the BR, thinks she also is constipated. Does reports urinary frequently and burning with urination. NAD noted, VSS.    Interpreter used Lawanna Kobus), ID [phone]

## 2024-02-21 NOTE — ED Provider Notes (Signed)
 11:50 PM  Assumed care at shift change.

## 2024-02-22 LAB — CHLAMYDIA/NGC RT PCR (ARMC ONLY)
Chlamydia Tr: NOT DETECTED
N gonorrhoeae: NOT DETECTED

## 2024-02-22 LAB — WET PREP, GENITAL
Clue Cells Wet Prep HPF POC: NONE SEEN
Sperm: NONE SEEN
Trich, Wet Prep: NONE SEEN
WBC, Wet Prep HPF POC: <10 (ref <10)
Yeast Wet Prep HPF POC: NONE SEEN

## 2024-02-26 NOTE — Telephone Encounter (Signed)
 No additional information for this pt.

## 2024-03-21 ENCOUNTER — Emergency Department
Admission: EM | Admit: 2024-03-21 | Discharge: 2024-03-21 | Disposition: A | Discharge status: Home / Self Care | Attending: Emergency Medicine | Admitting: Emergency Medicine

## 2024-03-21 ENCOUNTER — Other Ambulatory Visit: Payer: Self-pay

## 2024-03-21 ENCOUNTER — Emergency Department

## 2024-03-21 DIAGNOSIS — R42 Dizziness and giddiness: Secondary | ICD-10-CM | POA: Insufficient documentation

## 2024-03-21 DIAGNOSIS — R0789 Other chest pain: Secondary | ICD-10-CM | POA: Insufficient documentation

## 2024-03-21 DIAGNOSIS — R079 Chest pain, unspecified: Secondary | ICD-10-CM | POA: Diagnosis present

## 2024-03-21 LAB — CBC
HCT: 38.6 % (ref 36.0–46.0)
Hemoglobin: 12.9 g/dL (ref 12.0–15.0)
MCH: 28.6 pg (ref 26.0–34.0)
MCHC: 33.4 g/dL (ref 30.0–36.0)
MCV: 85.6 fL (ref 80.0–100.0)
Platelets: 292 10*3/uL (ref 150–400)
RBC: 4.51 MIL/uL (ref 3.87–5.11)
RDW: 12.4 % (ref 11.5–15.5)
WBC: 11.2 10*3/uL — ABNORMAL HIGH (ref 4.0–10.5)
nRBC: 0 % (ref 0.0–0.2)

## 2024-03-21 LAB — TROPONIN I (HIGH SENSITIVITY)
Troponin I (High Sensitivity): <2 ng/L (ref <18)
Troponin I (High Sensitivity): <2 ng/L (ref <18)

## 2024-03-21 LAB — BASIC METABOLIC PANEL WITH GFR
Anion gap: 8 (ref 5–15)
BUN: 18 mg/dL (ref 6–20)
CO2: 22 mmol/L (ref 22–32)
Calcium: 9.1 mg/dL (ref 8.9–10.3)
Chloride: 107 mmol/L (ref 98–111)
Creatinine, Ser: 0.62 mg/dL (ref 0.44–1.00)
GFR, Estimated: >60 mL/min (ref >60)
Glucose, Bld: 116 mg/dL — ABNORMAL HIGH (ref 70–99)
Potassium: 3.6 mmol/L (ref 3.5–5.1)
Sodium: 137 mmol/L (ref 135–145)

## 2024-03-21 NOTE — ED Provider Notes (Signed)
 [facility] Provider Note    Event Date/Time   First MD Initiated Contact with Patient 03/21/24 0421     (approximate)   History   Chest Pain  The patient and/or family speak(s) Spanish.  They understand they have the right to the use of a hospital interpreter, however at this time they prefer to speak directly with me in Spanish.  They know that they can ask for an interpreter at any time.   HPI Krista Garcia is a 28 y.o. female with no known chronic medical issues who presents for evaluation of a constellation of symptoms including some dizziness and feeling like she "wanted to pass out" for several hours while working.  She was also having some chest pressure, some shortness of breath, some pain in both of her shoulders and arms and over into her back.  The symptoms were worse when taking a deep breath.  She denies feeling particularly anxious or worried about anything.  She felt better what she left work and came to the emergency department.  She has been working hard and consistently.  She said that she has had several similar episodes of shortness of breath and dizziness in the past, in particular when she was in [place] but has not had the other sensation of chest pressure.  No nausea today but she had some yesterday.  Denies abdominal pain.  Physical Exam   Triage Vital Signs: ED Triage Vitals  Encounter Vitals Group     BP 03/21/24 0345 111/75     Systolic BP Percentile --      Diastolic BP Percentile --      Pulse Rate 03/21/24 0345 87     Resp 03/21/24 0345 16     Temp 03/21/24 0345 98.1 F (36.7 C)     Temp Source 03/21/24 0345 Oral     SpO2 03/21/24 0345 98 %     Weight 03/21/24 0345 81.6 kg (180 lb)     Height 03/21/24 0345 1.549 m (5\' 1" )     Head Circumference --      Peak Flow --      Pain Score 03/21/24 0342 8     Pain Loc --      Pain Education --      Exclude from Growth Chart --     Most recent vital  signs: Vitals:   03/21/24 0448 03/21/24 0624  BP: (!) 102/51 111/63  Pulse: 76   Resp: 20   Temp: 98.1 F (36.7 C)   SpO2: 100%     General: Awake, no distress.  Well-appearing. CV:  Good peripheral perfusion.  Regular rate and rhythm, normal heart sounds. Resp:  Normal effort. Speaking easily and comfortably, no accessory muscle usage nor intercostal retractions.  Lungs are clear to auscultation bilaterally. Abd:  No distention.  No tenderness to palpation throughout the abdomen. Other:  Patient is a bit anxious but not inappropriately so.  Otherwise alert and oriented and normal mood and affect.   ED Results / Procedures / Treatments   Labs (all labs ordered are listed, but only abnormal results are displayed) Labs Reviewed  BASIC METABOLIC PANEL WITH GFR - Abnormal; Notable for the following components:      Result Value   Glucose, Bld 116 (*)    All other components within normal limits  CBC - Abnormal; Notable for the following components:   WBC 11.2 (*)    All other components within normal limits  POC URINE PREG, ED  TROPONIN I (HIGH SENSITIVITY)  TROPONIN I (HIGH SENSITIVITY)     EKG  ED ECG REPORT I, Loleta Rose, the attending physician, personally viewed and interpreted this ECG.  Date: 03/21/2024 EKG Time: 3:47 AM Rate: 91 Rhythm: normal sinus rhythm QRS Axis: normal Intervals: normal ST/T Wave abnormalities: normal Narrative Interpretation: no evidence of acute ischemia    RADIOLOGY I independently viewed and interpreted the patient's two-view chest x-ray and I see no evidence of acute lobar pneumonia, widened mediastinum, nor other acute abnormality.  I also read the radiologist's report, which confirmed no acute findings.   PROCEDURES:  Critical Care performed: No  Procedures    IMPRESSION / MDM / ASSESSMENT AND PLAN / ED COURSE  I reviewed the triage vital signs and the nursing notes.                              Differential  diagnosis includes, but is not limited to, anxiety/stress, ACS, PE, pneumonia, pneumothorax, electrolyte or metabolic abnormality  Patient's presentation is most consistent with acute presentation with potential threat to life or bodily function.  Labs/studies ordered: Two-view chest x-ray, EKG, CBC, basic metabolic panel, high-sensitivity troponin x 2  Interventions/Medications given:  Medications - No data to display  (Note:  hospital course my include additional interventions and/or labs/studies not listed above.)   Vital signs are stable and within normal limits.  PERC negative, very low risk for ACS based on HEAR score.  Labs are within normal limits including 2 high-sensitivity troponins.  Normal EKG and imaging.  Patient is currently asymptomatic and I provided reassurance about the symptoms she had given that there is no evidence of an emergent medical condition..  Patient has an outpatient doctor with whom she can follow-up.  All the patient's medical screening exam is reassuring with no indication of an emergent medical condition requiring hospitalization or additional evaluation at this point.  The patient is safe and appropriate for discharge and outpatient follow up.       FINAL CLINICAL IMPRESSION(S) / ED DIAGNOSES   Final diagnoses:  Chest pressure  Dizziness     Rx / DC Orders   ED Discharge Orders     None        Note:  This document was prepared using Dragon voice recognition software and may include unintentional dictation errors.   Loleta Rose, MD 03/21/24 [phone]

## 2024-03-21 NOTE — Discharge Instructions (Addendum)
 Su evaluacin en Urgencias hoy fue tranquilizadora. No encontramos ninguna anomala especfica. Le recomendamos beber abundante lquido, tomar sus medicamentos habituales o los nuevos que le hayan recetado hoy, y acudir al/los mdico(s) de control indicado(s) en estos documentos, segn lo recomendado.  Regrese a Urgencias si presenta sntomas nuevos o que empeoran y [employer]  -----------------------  Your workup in the Emergency Department today was reassuring.  We did not find any specific abnormalities.  We recommend you drink plenty of fluids, take your regular medications and/or any new ones prescribed today, and follow up with the doctor(s) listed in these documents as recommended.  Return to the Emergency Department if you develop new or worsening symptoms that concern you.

## 2024-03-21 NOTE — ED Triage Notes (Signed)
 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 Yesterday pt had feeling of nausea, but none today. Denies SOB. A&O x4, NAD noted.   EMS vitals NSR 135/76 98 HR 98% RA [address] used [place], [place] # [phone]

## 2024-04-27 ENCOUNTER — Ambulatory Visit (INDEPENDENT_AMBULATORY_CARE_PROVIDER_SITE_OTHER): Admitting: Obstetrics

## 2024-04-27 ENCOUNTER — Encounter: Payer: Self-pay | Admitting: Obstetrics

## 2024-04-27 VITALS — BP 110/59 | HR 80 | Wt 181.0 lb

## 2024-04-27 DIAGNOSIS — Z3009 Encounter for other general counseling and advice on contraception: Secondary | ICD-10-CM

## 2024-04-27 NOTE — Progress Notes (Signed)
    GYNECOLOGY PROGRESS NOTE  Subjective:  PCP: Dionicia Frater, MD  Patient ID: Krista Garcia, female    DOB: 02/10/1996, 28 y.o.   MRN: [id number]  This encounter was facilitated by live Spanish language interpreter Mylinda Asa.  HPI  Patient is a 28 y.o. [id number] female who presents for consult for a BTL. Currently has Nexplanon, but feels like it's possibly giving her abd pain and anxiety. She saw CNM Jane on 01/20/24 for pelvic pain, has had for the past 2 yrs ever since the birth of her daughter, normal pelvic ultrasound in July of 2024.   The following portions of the patient's history were reviewed and updated as appropriate: allergies, current medications, past family history, past medical history, past social history, past surgical history, and problem list.  Review of Systems Pertinent items are noted in HPI.   Objective:   Blood pressure (!) 110/59, pulse 80, weight 181 lb (82.1 kg), currently breastfeeding. Body mass index is 34.2 kg/m.  General appearance: alert, cooperative, and appears stated age Abdomen: soft, non-tender; bowel sounds normal; no masses,  no organomegaly Pelvic: deferred Extremities: extremities normal, atraumatic, no cyanosis or edema Neurologic: Grossly normal  Assessment/Plan:   1. Encounter for consultation for female sterilization   2. Encounter for counseling regarding contraception    28 y.o. [id number] with Nexplanon in place, desiring BTL, but after discussing the R/B/A of BTL, she could not accept the risk of increased ectopic pregnancy, despite that risk being overall still very low.  Pt feels the Nexplanon may be causing abd/pelvic pain and anxiety, we reviewed ParaGard IUD as a non-hormonal alternative, and discussed other methods of contraception as well, going over their pros/cons. After much discussion, patient would like to continue with Nexplanon, but come in for replacement as it's going to expire soon and states she will be  without insurance after about 1 month. She will schedule to come in for replacement when able.    Total time was 32 minutes. That includes chart review before the visit, the actual patient visit, and time spent on documentation after the visit. Time excludes procedures, if any.    Sofia Dunn, DO [facility]

## 2024-05-05 ENCOUNTER — Ambulatory Visit: Admitting: Certified Nurse Midwife

## 2024-05-05 VITALS — BP 102/71 | HR 76 | Wt 181.5 lb

## 2024-05-05 DIAGNOSIS — Z3046 Encounter for surveillance of implantable subdermal contraceptive: Secondary | ICD-10-CM | POA: Diagnosis not present

## 2024-05-05 MED ORDER — ETONOGESTREL 68 MG ~~LOC~~ IMPL
68.0000 mg | DRUG_IMPLANT | Freq: Once | SUBCUTANEOUS | Status: AC
  Administered 2024-05-05: 68 mg via SUBCUTANEOUS

## 2024-05-05 NOTE — Patient Instructions (Signed)
 Nexplanon Instructions After Insertion  Keep bandage clean and dry for 24 hours  May use ice/Tylenol /Ibuprofen  for soreness or pain  If you develop fever, drainage or increased warmth from incision site-contact office immediately [phone].    Etonogestrel Implant Qu es este medicamento? El ETONOGESTREL evita la ovulacin y el embarazo. Pertenece a un grupo de medicamentos llamados anticonceptivos. Este medicamento es una hormona del grupo de los progestgenos. Este medicamento puede ser utilizado para otros usos; si tiene alguna pregunta consulte con su proveedor de atencin mdica o con su farmacutico. MARCAS COMUNES: Implanon, Nexplanon Qu le debo informar a mi profesional de la salud antes de tomar este medicamento? Necesitan saber si usted presenta alguno de los siguientes problemas o situaciones: Sangrado vaginal anormal Cogulos sanguneos Enfermedad vascular Cncer de mama, crvix, endometrio, ovario, hgado o tero Diabetes Enfermedad de la vescula biliar Enfermedad cardiaca o ataque cardiaco reciente Presin arterial alta Nivel elevado de colesterol o triglicridos Enfermedad renal Enfermedad heptica Migraas Convulsiones Accidente cerebrovascular Uso de tabaco Una reaccin alrgica o inusual al etonogestrel, a otros medicamentos, alimentos, colorantes o conservantes Si est embarazada o buscando quedar embarazada Si est amamantando a un beb Cmo debo utilizar este medicamento? Su equipo de atencin inserta este dispositivo justo debajo de la piel en la parte interior del brazo. Hable con su equipo de atencin sobre el uso de este medicamento en nios. Puede requerir atencin especial. Sobredosis: Pngase en contacto inmediatamente con un centro toxicolgico o una sala de urgencia si usted cree que haya tomado demasiado medicamento.<br>ATENCIN: [employer] es solo para usted. No comparta este medicamento con nadie. Qu sucede si me olvido de una  dosis? No se aplica en este caso. Qu puede interactuar con este medicamento? No use este medicamento con ninguno de los siguientes productos: Amprenavir Fosamprenavir Este medicamento tambin podra interactuar con los siguientes productos: Acitretina Aprepitant Armodafinilo Bexaroteno Bosentano Carbamazepina Ciertos medicamentos antivirales para VIH o hepatitis Ciertos medicamentos para infecciones micticas, tales como fluconazol, ketoconazol, itraconazol o voriconazol Ciclosporina Felbamato Griseofulvina Lamotrigina Modafinilo Autoliv Fenitona Primidona Rifabutina Rifampicina Rifapentina Hierba de [place] Topiramato Puede ser que esta lista no menciona todas las posibles interacciones. Informe a su profesional de [employer] de [employer] productos a base de hierbas, medicamentos de [place] o suplementos nutritivos que est tomando. Si usted fuma, consume bebidas alcohlicas o si utiliza drogas ilegales, indqueselo tambin a su profesional de [employer]. Algunas sustancias pueden interactuar con su medicamento. A qu debo estar atento al usar [employer]? Visite a su equipo de atencin para que revise su evolucin peridicamente. Usar [employer] no los protege ni a usted ni a su pareja de la infeccin por VIH ni de ninguna otra infeccin de transmisin sexual. Usted debe poder sentir el implante al presionar la piel donde se insert con la yema de los dedos. Contacte a su equipo de atencin si no puede sentir el implante, y use un mtodo anticonceptivo no hormonal (como condones) hasta que su equipo de atencin confirme que el implante est en su Environmental consultant. Contacte a su equipo de atencin si cree que el implante puede haberse roto o doblado dentro del brazo. Su equipo de Aeronautical engineer una tarjeta de usuario despus de insertar el implante. La tarjeta es un registro de la ubicacin del implante en su brazo e indica cundo debe retirarse.  Conserve esta tarjeta con sus registros mdicos. Qu efectos secundarios puedo tener al [employer] este medicamento? Efectos secundarios que debe informar a su equipo de  atencin tan pronto como sea posible: Reacciones alrgicas: erupcin cutnea, comezn/picazn, urticaria, hinchazn de la cara, los labios, la lengua o la garganta Cogulo sanguneo: Engineer, mining, hinchazn, calor en una pierna, falta de aire, dolor en el pecho Problemas en la vescula biliar: dolor de estmago intenso, nuseas, vmitos, fiebre Aumento de la presin arterial Lesin en el hgado: dolor en la regin abdominal superior derecha, prdida de apetito, nuseas, heces de color claro, orina amarilla oscura o marrn, color amarillento de los ojos o la piel, debilidad o fatiga inusuales Migraas o dolores de cabeza nuevos o Contractor, enrojecimiento o Marketing executive de la inyeccin Accidente cerebrovascular: entumecimiento o debilidad repentinos de la cara, un brazo o una pierna, dificultad para hablar, confusin, dificultad para caminar, prdida de equilibrio o coordinacin, mareos, dolor de cabeza intenso, cambio en la visin Flujo vaginal inusual, comezn/picazn u olor Empeoramiento del estado de nimo, sentimientos de depresin Efectos secundarios que generalmente no requieren atencin mdica (debe informarlos a su equipo de atencin si persisten o si son molestos): Engineer, mining o sensibilidad de las Designer, fashion/clothing oscuras en la piel de la cara o de otras reas expuestas al sol Ciclos menstruales irregulares o sangrado ligero entre periodos menstruales Nuseas Aumento de peso Puede ser que esta lista no menciona todos los posibles efectos secundarios. Comunquese a su mdico por asesoramiento mdico [employer]. Usted puede informar los efectos secundarios a la FDA por telfono al 1-800-FDA-1088. Dnde debo guardar mi medicina? Este medicamento se administra en hospitales o clnicas y no es necesario  guardarlo en su domicilio. ATENCIN: Este folleto es un resumen. Puede ser que no cubra toda la posible informacin. Si usted tiene preguntas acerca de esta medicina, consulte con su mdico, su farmacutico o su profesional de Radiographer, therapeutic.  2024 Elsevier/Gold Standard (2022-10-22 00:00:00)

## 2024-05-05 NOTE — Progress Notes (Signed)
    GYNECOLOGY PROCEDURE NOTE  Patient is a 28 y.o. [id number] presenting for Nexplanon removal & reinsertion as her desired means of contraception. Nexplanon removal & reinsertion discussed in detail. Risks of infection, bleeding, nerve injury all reviewed. Patient understands risks and desires to proceed. She provided informed consent, signed copy in the chart, time out was performed. LMP of No LMP recorded. Patient has had an implant.   She understands that Nexplanon is a progesterone only therapy, and that patients often have irregular and unpredictable vaginal bleeding or amenorrhea. She understands that other side effects are possible related to systemic progesterone, including but not limited to, headaches, breast tenderness, nausea, and irritability. While effective at preventing pregnancy long acting reversible contraceptives do not prevent transmission of sexually transmitted diseases and use of barrier methods for this purpose was discussed. The placement procedure for Nexplanon was reviewed with the patient in detail including risks of nerve injury, infection, bleeding and injury to other muscles or tendons. She understands that the Nexplanon implant is good for 3 years and needs to be removed at the end of that time. She understands that Nexplanon is an extremely effective option for contraception, with failure rate of <1%. This information is reviewed today and all questions were answered. Informed consent was obtained, both verbally and written.   The patient is healthy and has no contraindications to Nexplanon use.     Vital Signs: BP 102/71   Pulse 76   Wt 181 lb 8 oz (82.3 kg)   BMI 34.29 kg/m  Constitutional: Well nourished, well developed female in no acute distress.  Skin: Warm and dry.  Cardiovascular: Regular rate Respiratory: Normal respiratory effort Psych: Alert and Oriented x3. No memory deficits. Normal mood and affect.    Procedure Appropriate time out taken.  Patient placed in dorsal supine with left arm above head, elbow flexed at 90 degrees, arm resting on examination table. Nexplanon identified without problems. Cleansed with chlorhexidine. 2 ml of 1% lidocaine  injected under Nexplanon device without problems. Sterile gloves applied. Small 0.5cm incision made at distal tip of Nexplanon device with 11 blade scalpel. Nexplanon brought to incision and grasped with a small kelly clamp. Nexplanon removed intact without problems. New Nexplanon then inserted in existing incision site. The implant was palpable by the clinician as well as the patient. The insertion site was dressed with a steri strip and band aid before applying Coban pressure dressing. Minimal blood loss was noted during the procedure. The patient tolerated the procedure well.   She was instructed to wear the bandage for 24 hours, call with any signs of infection. She was given the Nexplanon card and instructed to have the rod removed in 3 years.   Forestine Igo, CNM  Ob/Gyn [facility] 05/05/2024 4:54 PM

## 2024-11-21 ENCOUNTER — Emergency Department: Admission: EM | Admit: 2024-11-21 | Discharge: 2024-11-22 | Disposition: A

## 2024-11-21 ENCOUNTER — Emergency Department

## 2024-11-21 ENCOUNTER — Other Ambulatory Visit: Payer: Self-pay

## 2024-11-21 DIAGNOSIS — R1030 Lower abdominal pain, unspecified: Secondary | ICD-10-CM

## 2024-11-21 DIAGNOSIS — R10A3 Flank pain, bilateral: Secondary | ICD-10-CM

## 2024-11-21 LAB — COMPREHENSIVE METABOLIC PANEL WITH GFR
ALT: 26 U/L (ref 0–44)
AST: 20 U/L (ref 15–41)
Albumin: 4.6 g/dL (ref 3.5–5.0)
Alkaline Phosphatase: 113 U/L (ref 38–126)
Anion gap: 13 (ref 5–15)
BUN: 13 mg/dL (ref 6–20)
CO2: 24 mmol/L (ref 22–32)
Calcium: 9.6 mg/dL (ref 8.9–10.3)
Chloride: 101 mmol/L (ref 98–111)
Creatinine, Ser: 0.64 mg/dL (ref 0.44–1.00)
GFR, Estimated: >60 mL/min (ref >60)
Glucose, Bld: 97 mg/dL (ref 70–99)
Potassium: 3.6 mmol/L (ref 3.5–5.1)
Sodium: 138 mmol/L (ref 135–145)
Total Bilirubin: <0.2 mg/dL (ref 0.0–1.2)
Total Protein: 7.8 g/dL (ref 6.5–8.1)

## 2024-11-21 LAB — CBC
HCT: 40.3 % (ref 36.0–46.0)
Hemoglobin: 13.2 g/dL (ref 12.0–15.0)
MCH: 27.8 pg (ref 26.0–34.0)
MCHC: 32.8 g/dL (ref 30.0–36.0)
MCV: 84.8 fL (ref 80.0–100.0)
Platelets: 329 K/uL (ref 150–400)
RBC: 4.75 MIL/uL (ref 3.87–5.11)
RDW: 12.1 % (ref 11.5–15.5)
WBC: 11.7 K/uL — ABNORMAL HIGH (ref 4.0–10.5)
nRBC: 0 % (ref 0.0–0.2)

## 2024-11-21 LAB — URINALYSIS, ROUTINE W REFLEX MICROSCOPIC
Bilirubin Urine: NEGATIVE
Glucose, UA: NEGATIVE mg/dL
Hgb urine dipstick: NEGATIVE
Ketones, ur: NEGATIVE mg/dL
Leukocytes,Ua: NEGATIVE
Nitrite: NEGATIVE
Protein, ur: NEGATIVE mg/dL
Specific Gravity, Urine: 1.005 (ref 1.005–1.030)
pH: 6 (ref 5.0–8.0)

## 2024-11-21 LAB — LIPASE, BLOOD: Lipase: 38 U/L (ref 11–51)

## 2024-11-21 LAB — PREGNANCY, URINE: Preg Test, Ur: NEGATIVE

## 2024-11-21 MED ORDER — IOHEXOL 300 MG/ML  SOLN
100.0000 mL | Freq: Once | INTRAMUSCULAR | Status: AC | PRN
  Administered 2024-11-21: 100 mL via INTRAVENOUS

## 2024-11-21 MED ORDER — SODIUM CHLORIDE 0.9 % IV BOLUS
1000.0000 mL | Freq: Once | INTRAVENOUS | Status: AC
  Administered 2024-11-21: 1000 mL via INTRAVENOUS

## 2024-11-21 MED ORDER — ONDANSETRON HCL 4 MG/2ML IJ SOLN
4.0000 mg | Freq: Once | INTRAMUSCULAR | Status: AC
  Administered 2024-11-21: 4 mg via INTRAVENOUS
  Filled 2024-11-21: qty 2

## 2024-11-21 NOTE — ED Triage Notes (Signed)
 Pt to ED via POV from home. Pt reports abd pain, back pain and N/V/D that started over the last few days and has gotten worse.

## 2024-11-21 NOTE — ED Provider Notes (Signed)
-----------------------------------------   11:22 PM on 11/21/2024 -----------------------------------------  Blood pressure 113/65, pulse 78, temperature 97.8 F (36.6 C), temperature source Oral, resp. rate 20, height 1.626 m (5' 4), weight 84.4 kg, SpO2 100%, not currently breastfeeding.   Assuming care from Dr. Nicholaus.  In short, Krista Garcia is a 28 y.o. female with a chief complaint of abdominal and flank pain bilaterally.  Refer to the original H&P for additional details.  The current plan of care is to follow up on CT scan and reassess   Clinical Course as of 11/21/24 2352  Mon Nov 21, 2024  2343 CT ABDOMEN PELVIS W CONTRAST I independently viewed and interpreted the patient's abd/pelvis CT, as well as reviewing the radiologist's report.  I cannot identify any acute infectious process such as appendicitis and no evidence of any stones. [CF]  2350 I reassessed the patient and she is feeling better.  She is still concerned but understands that her workup was very reassuring with no emergent cause of her symptoms.  She will follow-up as an outpatient.  I gave my usual and customary follow-up recommendations and return precautions. [CF]    Clinical Course User Index [CF] Gordan Huxley, MD     Medications  sodium chloride  0.9 % bolus 1,000 mL (1,000 mLs Intravenous New Bag/Given 11/21/24 2227)  ondansetron  (ZOFRAN ) injection 4 mg (4 mg Intravenous Given 11/21/24 2222)  iohexol  (OMNIPAQUE ) 300 MG/ML solution 100 mL (100 mLs Intravenous Contrast Given 11/21/24 2320)     ED Discharge Orders     None      Final diagnoses:  Lower abdominal pain  Bilateral flank pain     Gordan Huxley, MD 11/21/24 2352

## 2024-11-21 NOTE — Discharge Instructions (Signed)
 You have been seen in the Emergency Department (ED) for abdominal pain.  Your evaluation did not identify a clear cause of your symptoms but was generally reassuring.  Please follow up as instructed above regarding today's emergent visit and the symptoms that are bothering you.  Return to the ED if your abdominal pain worsens or fails to improve, you develop bloody vomiting, bloody diarrhea, you are unable to tolerate fluids due to vomiting, fever greater than 101, or other symptoms that concern you. --------------- Twana jolly atendido/a en el Servicio de Urgencias por dolor abdominal. La evaluacin no identific una causa clara de sus sntomas, pero los resultados fueron generalmente tranquilizadores.  Por favor, siga las instrucciones indicadas anteriormente con respecto a la visita de urgencia de hoy y los sntomas que le [place].  Regrese al [employer] de Urgencias si el dolor abdominal empeora o no mejora, si presenta vmitos con sangre, diarrea con sangre, si no puede tolerar lquidos debido a los vmitos, si tiene fiebre superior a 38,3 C (101 F) o si presenta otros sntomas que le preocupen.

## 2024-11-21 NOTE — ED Provider Notes (Signed)
 [facility] Provider Note    Event Date/Time   First MD Initiated Contact with Patient 11/21/24 2134     (approximate)   History   Abdominal Pain   HPI History and physical was obtained using the Spanish interpreter/iPad  Krista Garcia is a 28 y.o. female with no significant past medical history takes no medications except for Tylenol  and a vitamin regularly who presents with 3 days of nausea vomiting diarrhea and abdominal pain patient states that she has pain in both her right lower quadrant and left lower quadrant that radiates to her bilateral flanks.  Reports several episodes of diarrhea but nonbloody and none dark.  Has nausea but no episodes of emesis.  She does not get her period regularly and denies any concern for sexually transmitted diseases and denies any vaginal discharge.      Physical Exam   Triage Vital Signs: ED Triage Vitals  Encounter Vitals Group     BP 11/21/24 1839 (!) 127/92     Girls Systolic BP Percentile --      Girls Diastolic BP Percentile --      Boys Systolic BP Percentile --      Boys Diastolic BP Percentile --      Pulse Rate 11/21/24 1839 87     Resp 11/21/24 1839 20     Temp 11/21/24 1841 97.7 F (36.5 C)     Temp Source 11/21/24 1841 Oral     SpO2 11/21/24 1839 98 %     Weight --      Height --      Head Circumference --      Peak Flow --      Pain Score 11/21/24 1839 9     Pain Loc --      Pain Education --      Exclude from Growth Chart --     Most recent vital signs: Vitals:   11/21/24 1841 11/21/24 2230  BP:  113/65  Pulse:  78  Resp:  20  Temp: 97.7 F (36.5 C) 97.8 F (36.6 C)  SpO2:  100%    Nursing Triage Note reviewed. Vital signs reviewed and patients oxygen saturation is normoxic  General: Patient is well nourished, well developed, awake and alert, resting comfortably Head: Normocephalic and atraumatic Eyes: Normal inspection, extraocular muscles intact, no conjunctival  pallor Ear, nose, throat: Normal external exam Neck: Normal range of motion Respiratory: Patient is in no respiratory distress, lungs CTAB Cardiovascular: Patient is not tachycardic, RRR without murmur appreciated GI: Abd Soft,  with no guarding or rebound  Back: Normal inspection of the back with good strength and range of motion throughout all ext Extremities: pulses intact with good cap refills, no LE pitting edema or calf tenderness Neuro: The patient is alert and oriented to person, place, and time, appropriately conversive, with 5/5 bilat UE/LE strength, no gross motor or sensory defects noted. Coordination appears to be adequate. Skin: Warm, dry, and intact Psych: normal mood and affect, no SI or HI  ED Results / Procedures / Treatments   Labs (all labs ordered are listed, but only abnormal results are displayed) Labs Reviewed  CBC - Abnormal; Notable for the following components:      Result Value   WBC 11.7 (*)    All other components within normal limits  URINALYSIS, ROUTINE W REFLEX MICROSCOPIC - Abnormal; Notable for the following components:   Color, Urine STRAW (*)    APPearance CLEAR (*)  All other components within normal limits  LIPASE, BLOOD  COMPREHENSIVE METABOLIC PANEL WITH GFR  PREGNANCY, URINE     EKG None  RADIOLOGY CT abd and pelvis with iv contrast: pending    PROCEDURES:  Critical Care performed: No  Procedures   MEDICATIONS ORDERED IN ED: Medications  sodium chloride  0.9 % bolus 1,000 mL (1,000 mLs Intravenous New Bag/Given 11/21/24 2227)  ondansetron  (ZOFRAN ) injection 4 mg (4 mg Intravenous Given 11/21/24 2222)     IMPRESSION / MDM / ASSESSMENT AND PLAN / ED COURSE                                Differential diagnosis includes, but is not limited to: Pyelonephritis, perinephric abscess, colitis, diverticulitis, UTI, electrolyte derangement anemia, muscle spasm   ED course: Patient is well-appearing and abdominal exam  demonstrates no evidence of peritonitis.  Pregnancy test is negative and her urinalysis is unremarkable.  She does have a slight leukocytosis of 11.7 but no acute anemia.  No profound electrolyte derangements no acute renal insufficiency, no elevation of her liver function tests or lipase.  Patient is still concerned and I did discuss the indications benefits risk and alternatives of a CT abdomen and pelvis with her and she voiced understanding and does well at this test.  This is currently pending.  She will be signed out to oncoming physician pending these results     -- Risk: 5 This patient has a high risk of morbidity due to further diagnostic testing or treatment. Rationale: This patient's evaluation and management involve a high risk of morbidity due to the potential severity of presenting symptoms, need for diagnostic testing, and/or initiation of treatment that may require close monitoring. The differential includes conditions with potential for significant deterioration or requiring escalation of care. Treatment decisions in the ED, including medication administration, procedural interventions, or disposition planning, reflect this level of risk. COPA: 5 The patient has the following acute or chronic illness/injury that poses a possible threat to life or bodily function: [X] : The patient has a potentially serious acute condition or an acute exacerbation of a chronic illness requiring urgent evaluation and management in the Emergency Department. The clinical presentation necessitates immediate consideration of life-threatening or function-threatening diagnoses, even if they are ultimately ruled out.   FINAL CLINICAL IMPRESSION(S) / ED DIAGNOSES   Final diagnoses:  Lower abdominal pain  Bilateral flank pain     Rx / DC Orders   ED Discharge Orders     None        Note:  This document was prepared using Dragon voice recognition software and may include unintentional dictation  errors.   Nicholaus Rolland BRAVO, MD 11/21/24 [phone]
